# Patient Record
Sex: Female | Born: 1935 | Race: White | Hispanic: No | Marital: Married | Smoking: Former smoker
Health system: Southern US, Community
[De-identification: ages and names within clinical notes are randomized; demographics above are authoritative.]

## PROBLEM LIST (undated history)

## (undated) DIAGNOSIS — M810 Age-related osteoporosis without current pathological fracture: Secondary | ICD-10-CM

## (undated) DIAGNOSIS — H269 Unspecified cataract: Secondary | ICD-10-CM

## (undated) DIAGNOSIS — F329 Major depressive disorder, single episode, unspecified: Secondary | ICD-10-CM

## (undated) DIAGNOSIS — F32A Depression, unspecified: Secondary | ICD-10-CM

## (undated) DIAGNOSIS — M1990 Unspecified osteoarthritis, unspecified site: Secondary | ICD-10-CM

## (undated) DIAGNOSIS — T7840XA Allergy, unspecified, initial encounter: Secondary | ICD-10-CM

## (undated) HISTORY — DX: Allergy, unspecified, initial encounter: T78.40XA

## (undated) HISTORY — DX: Major depressive disorder, single episode, unspecified: F32.9

## (undated) HISTORY — DX: Unspecified osteoarthritis, unspecified site: M19.90

## (undated) HISTORY — DX: Unspecified cataract: H26.9

## (undated) HISTORY — PX: OTHER SURGICAL HISTORY: SHX169

## (undated) HISTORY — DX: Age-related osteoporosis without current pathological fracture: M81.0

## (undated) HISTORY — DX: Depression, unspecified: F32.A

---

## 1999-12-28 ENCOUNTER — Ambulatory Visit (HOSPITAL_COMMUNITY): Admission: RE | Admit: 1999-12-28 | Discharge: 1999-12-28 | Payer: Self-pay | Admitting: *Deleted

## 2000-05-04 ENCOUNTER — Encounter: Payer: Self-pay | Admitting: *Deleted

## 2000-05-04 ENCOUNTER — Encounter: Admission: RE | Admit: 2000-05-04 | Discharge: 2000-05-04 | Payer: Self-pay | Admitting: *Deleted

## 2000-06-28 ENCOUNTER — Other Ambulatory Visit: Admission: RE | Admit: 2000-06-28 | Discharge: 2000-06-28 | Payer: Self-pay | Admitting: *Deleted

## 2001-12-04 ENCOUNTER — Other Ambulatory Visit: Admission: RE | Admit: 2001-12-04 | Discharge: 2001-12-04 | Payer: Self-pay | Admitting: Internal Medicine

## 2001-12-12 ENCOUNTER — Encounter: Payer: Self-pay | Admitting: Internal Medicine

## 2001-12-12 ENCOUNTER — Encounter: Admission: RE | Admit: 2001-12-12 | Discharge: 2001-12-12 | Payer: Self-pay | Admitting: Internal Medicine

## 2002-04-29 ENCOUNTER — Encounter: Admission: RE | Admit: 2002-04-29 | Discharge: 2002-04-29 | Payer: Self-pay | Admitting: Internal Medicine

## 2002-04-29 ENCOUNTER — Encounter: Payer: Self-pay | Admitting: Internal Medicine

## 2002-05-08 ENCOUNTER — Encounter: Admission: RE | Admit: 2002-05-08 | Discharge: 2002-05-08 | Payer: Self-pay | Admitting: Internal Medicine

## 2002-05-08 ENCOUNTER — Encounter: Payer: Self-pay | Admitting: Internal Medicine

## 2002-05-17 ENCOUNTER — Ambulatory Visit (HOSPITAL_COMMUNITY): Admission: RE | Admit: 2002-05-17 | Discharge: 2002-05-17 | Payer: Self-pay | Admitting: Gastroenterology

## 2002-06-12 ENCOUNTER — Ambulatory Visit (HOSPITAL_COMMUNITY): Admission: RE | Admit: 2002-06-12 | Discharge: 2002-06-12 | Payer: Self-pay | Admitting: Gastroenterology

## 2004-05-05 ENCOUNTER — Encounter: Admission: RE | Admit: 2004-05-05 | Discharge: 2004-05-05 | Payer: Self-pay | Admitting: Internal Medicine

## 2004-07-27 ENCOUNTER — Encounter: Admission: RE | Admit: 2004-07-27 | Discharge: 2004-07-27 | Payer: Self-pay | Admitting: Internal Medicine

## 2004-11-08 ENCOUNTER — Encounter: Admission: RE | Admit: 2004-11-08 | Discharge: 2005-02-06 | Payer: Self-pay | Admitting: *Deleted

## 2005-05-31 ENCOUNTER — Encounter: Admission: RE | Admit: 2005-05-31 | Discharge: 2005-05-31 | Payer: Self-pay | Admitting: Internal Medicine

## 2005-09-09 ENCOUNTER — Encounter: Admission: RE | Admit: 2005-09-09 | Discharge: 2005-09-09 | Payer: Self-pay | Admitting: Internal Medicine

## 2006-01-19 ENCOUNTER — Other Ambulatory Visit: Admission: RE | Admit: 2006-01-19 | Discharge: 2006-01-19 | Payer: Self-pay | Admitting: Obstetrics and Gynecology

## 2007-08-01 ENCOUNTER — Encounter: Admission: RE | Admit: 2007-08-01 | Discharge: 2007-08-01 | Payer: Self-pay | Admitting: Internal Medicine

## 2008-09-02 ENCOUNTER — Encounter: Admission: RE | Admit: 2008-09-02 | Discharge: 2008-09-02 | Payer: Self-pay | Admitting: Internal Medicine

## 2008-09-05 ENCOUNTER — Encounter: Admission: RE | Admit: 2008-09-05 | Discharge: 2008-09-05 | Payer: Self-pay | Admitting: Internal Medicine

## 2009-09-03 ENCOUNTER — Encounter: Admission: RE | Admit: 2009-09-03 | Discharge: 2009-09-03 | Payer: Self-pay | Admitting: Internal Medicine

## 2010-05-10 ENCOUNTER — Encounter: Payer: Self-pay | Admitting: Internal Medicine

## 2010-09-27 ENCOUNTER — Other Ambulatory Visit: Payer: Self-pay | Admitting: Internal Medicine

## 2010-09-27 DIAGNOSIS — Z1231 Encounter for screening mammogram for malignant neoplasm of breast: Secondary | ICD-10-CM

## 2010-09-29 ENCOUNTER — Ambulatory Visit
Admission: RE | Admit: 2010-09-29 | Discharge: 2010-09-29 | Disposition: A | Discharge status: Home / Self Care | Payer: Medicare Other | Source: Ambulatory Visit | Attending: Internal Medicine | Admitting: Internal Medicine

## 2010-09-29 DIAGNOSIS — Z1231 Encounter for screening mammogram for malignant neoplasm of breast: Secondary | ICD-10-CM

## 2011-01-27 ENCOUNTER — Other Ambulatory Visit: Payer: Self-pay | Admitting: Neurology

## 2011-01-27 DIAGNOSIS — R430 Anosmia: Secondary | ICD-10-CM

## 2011-01-28 ENCOUNTER — Ambulatory Visit
Admission: RE | Admit: 2011-01-28 | Discharge: 2011-01-28 | Disposition: A | Discharge status: Home / Self Care | Payer: Medicare Other | Source: Ambulatory Visit | Attending: Neurology | Admitting: Neurology

## 2011-01-28 DIAGNOSIS — R430 Anosmia: Secondary | ICD-10-CM

## 2011-05-16 ENCOUNTER — Other Ambulatory Visit: Payer: Self-pay | Admitting: Surgery

## 2011-11-28 ENCOUNTER — Other Ambulatory Visit: Payer: Self-pay | Admitting: Internal Medicine

## 2011-11-28 DIAGNOSIS — R6881 Early satiety: Secondary | ICD-10-CM

## 2011-12-07 ENCOUNTER — Ambulatory Visit
Admission: RE | Admit: 2011-12-07 | Discharge: 2011-12-07 | Disposition: A | Discharge status: Home / Self Care | Payer: Medicare Other | Source: Ambulatory Visit | Attending: Internal Medicine | Admitting: Internal Medicine

## 2011-12-07 ENCOUNTER — Other Ambulatory Visit: Payer: Self-pay | Admitting: Internal Medicine

## 2011-12-07 DIAGNOSIS — R6881 Early satiety: Secondary | ICD-10-CM

## 2013-05-28 ENCOUNTER — Ambulatory Visit: Payer: Medicare Other

## 2013-05-28 ENCOUNTER — Ambulatory Visit (INDEPENDENT_AMBULATORY_CARE_PROVIDER_SITE_OTHER): Payer: Medicare Other | Admitting: Family Medicine

## 2013-05-28 VITALS — BP 120/62 | HR 61 | Temp 98.2°F | Resp 16 | Ht 63.0 in | Wt 117.0 lb

## 2013-05-28 DIAGNOSIS — R05 Cough: Secondary | ICD-10-CM

## 2013-05-28 DIAGNOSIS — R042 Hemoptysis: Secondary | ICD-10-CM

## 2013-05-28 DIAGNOSIS — R0781 Pleurodynia: Secondary | ICD-10-CM

## 2013-05-28 DIAGNOSIS — R059 Cough, unspecified: Secondary | ICD-10-CM

## 2013-05-28 DIAGNOSIS — S2001XA Contusion of right breast, initial encounter: Secondary | ICD-10-CM

## 2013-05-28 DIAGNOSIS — H11009 Unspecified pterygium of unspecified eye: Secondary | ICD-10-CM

## 2013-05-28 NOTE — Patient Instructions (Signed)
Take Tylenol not to exceed 2 pills 3 times daily  Practice good deep breathing to the best of your ability given the pain  Return if worse

## 2013-05-28 NOTE — Progress Notes (Signed)
Subjective: Patient tripped and fell on a heavy cardboard box 3 days ago. She is hurting on the right chest wall and has bruising of her right breast. It hurts to take a deep breath.  Objective: Lungs are clear. She has a large ecchymosis in the lower outer quadrant of the right breast. Hematoma is about 1 inch in diameter. Chest wall is tender to palpation underneath that.  [facility] reading (PRIMARY) by  Dr. Alwyn RenHopper No rib fracture noted  Assessment: Chest wall contusion Right breast contusion and small hematoma  Plan: Tylenol Give it time it should resolve Return if worse.

## 2014-02-21 ENCOUNTER — Other Ambulatory Visit: Payer: Self-pay | Admitting: Internal Medicine

## 2014-02-21 ENCOUNTER — Ambulatory Visit
Admission: RE | Admit: 2014-02-21 | Discharge: 2014-02-21 | Disposition: A | Discharge status: Home / Self Care | Payer: Medicare Other | Source: Ambulatory Visit | Attending: Internal Medicine | Admitting: Internal Medicine

## 2014-02-21 DIAGNOSIS — K5909 Other constipation: Secondary | ICD-10-CM

## 2015-08-13 ENCOUNTER — Emergency Department (HOSPITAL_COMMUNITY): Payer: Medicare Other

## 2015-08-13 ENCOUNTER — Other Ambulatory Visit: Payer: Self-pay

## 2015-08-13 ENCOUNTER — Emergency Department (HOSPITAL_COMMUNITY)
Admission: EM | Admit: 2015-08-13 | Discharge: 2015-08-13 | Disposition: A | Discharge status: Home / Self Care | Payer: Medicare Other | Attending: Emergency Medicine | Admitting: Emergency Medicine

## 2015-08-13 DIAGNOSIS — R002 Palpitations: Secondary | ICD-10-CM | POA: Insufficient documentation

## 2015-08-13 DIAGNOSIS — Z87891 Personal history of nicotine dependence: Secondary | ICD-10-CM | POA: Diagnosis not present

## 2015-08-13 DIAGNOSIS — M25511 Pain in right shoulder: Secondary | ICD-10-CM | POA: Insufficient documentation

## 2015-08-13 DIAGNOSIS — Z8659 Personal history of other mental and behavioral disorders: Secondary | ICD-10-CM | POA: Insufficient documentation

## 2015-08-13 DIAGNOSIS — Z8639 Personal history of other endocrine, nutritional and metabolic disease: Secondary | ICD-10-CM | POA: Diagnosis not present

## 2015-08-13 DIAGNOSIS — H269 Unspecified cataract: Secondary | ICD-10-CM | POA: Diagnosis not present

## 2015-08-13 DIAGNOSIS — M25512 Pain in left shoulder: Secondary | ICD-10-CM | POA: Diagnosis not present

## 2015-08-13 DIAGNOSIS — Z79899 Other long term (current) drug therapy: Secondary | ICD-10-CM | POA: Insufficient documentation

## 2015-08-13 LAB — CBC
HCT: 40.2 % (ref 36.0–46.0)
Hemoglobin: 13.6 g/dL (ref 12.0–15.0)
MCH: 28.1 pg (ref 26.0–34.0)
MCHC: 33.8 g/dL (ref 30.0–36.0)
MCV: 83.1 fL (ref 78.0–100.0)
Platelets: 226 10*3/uL (ref 150–400)
RBC: 4.84 MIL/uL (ref 3.87–5.11)
RDW: 13.7 % (ref 11.5–15.5)
WBC: 7.7 10*3/uL (ref 4.0–10.5)

## 2015-08-13 LAB — BASIC METABOLIC PANEL
Anion gap: 12 (ref 5–15)
BUN: 12 mg/dL (ref 6–20)
CO2: 22 mmol/L (ref 22–32)
Calcium: 9.5 mg/dL (ref 8.9–10.3)
Chloride: 109 mmol/L (ref 101–111)
Creatinine, Ser: 0.99 mg/dL (ref 0.44–1.00)
GFR calc Af Amer: 57 mL/min — ABNORMAL LOW (ref >60)
GFR calc non Af Amer: 49 mL/min — ABNORMAL LOW (ref >60)
Glucose, Bld: 79 mg/dL (ref 65–99)
Potassium: 3.7 mmol/L (ref 3.5–5.1)
Sodium: 143 mmol/L (ref 135–145)

## 2015-08-13 LAB — I-STAT TROPONIN, ED
Troponin i, poc: 0 ng/mL (ref 0.00–0.08)
Troponin i, poc: 0 ng/mL (ref 0.00–0.08)

## 2015-08-13 NOTE — ED Provider Notes (Signed)
CSN: [id number]     Arrival date & time 08/13/15  1102 History   First MD Initiated Contact with Patient 08/13/15 1229     Chief Complaint  Patient presents with  . Palpitations     (Consider location/radiation/quality/duration/timing/severity/associated sxs/prior Treatment) HPI Comments: 82 year old female with history of osteoporosis presents for palpitations. The patient and her daughter state that over the last 2 or so months she has had intermittent episodes of feeling like her heart is racing and pounding very hard. She says she also gets a pain across her shoulders when this occurs. These episodes resolve on their own with rest. She saw her primary care physician who suggested that she follow-up with a cardiologist outpatient but has yet to do so. She reports that she had another episode like this this morning and that she does typically feel short of breath with them and felt short of breath at that time. She reports that these symptoms have all resolved and she currently feels well. She has continued to be able to partake in her activities including dancing. Denies leg swelling. Denies headache. No arm pain or jaw pain. No focal weakness or sensory changes.   Past Medical History  Diagnosis Date  . Allergy   . Arthritis   . Cataract   . Depression   . Osteoporosis    Past Surgical History  Procedure Laterality Date  . Cataracts     Family History  Problem Relation Age of Onset  . Stroke Mother   . Stroke Father   . Heart disease Father    Social History  Substance Use Topics  . Smoking status: Former Games developer  . Smokeless tobacco: Not on file  . Alcohol Use: No   OB History    No data available     Review of Systems  Constitutional: Negative for fever, chills, appetite change and fatigue.  HENT: Negative for congestion, rhinorrhea, sinus pressure and sore throat.   Eyes: Negative for visual disturbance.  Respiratory: Positive for shortness of breath (resolved).  Negative for cough and chest tightness.   Cardiovascular: Positive for palpitations. Negative for chest pain.  Gastrointestinal: Negative for nausea, vomiting, abdominal pain and diarrhea.  Genitourinary: Negative for dysuria, urgency and hematuria.  Musculoskeletal: Positive for back pain (currently resolved but pain across the shoulders). Negative for myalgias and neck pain.  Skin: Negative for rash.  Neurological: Negative for dizziness, syncope, weakness and numbness.  Hematological: Does not bruise/bleed easily.      Allergies  Review of patient's allergies indicates no known allergies.  Home Medications   Prior to Admission medications   Medication Sig Start Date End Date Taking? Authorizing Provider  Ascorbic Acid (VITAMIN C) 100 MG tablet Take 100 mg by mouth daily.   Yes Historical Provider, MD  Multiple Vitamin (MULTIVITAMIN) tablet Take 1 tablet by mouth daily.   Yes Historical Provider, MD  omeprazole (PRILOSEC) 10 MG capsule Take 10 mg by mouth daily.   Yes Historical Provider, MD  vitamin B-12 (CYANOCOBALAMIN) 1000 MCG tablet Take 1,000 mcg by mouth daily.   Yes Historical Provider, MD  vitamin E 100 UNIT capsule Take 100 Units by mouth daily.   Yes Historical Provider, MD   BP 112/55 mmHg  Pulse 51  Temp(Src) 97.8 F (36.6 C) (Oral)  Resp 19  SpO2 97% Physical Exam  Constitutional: She is oriented to person, place, and time. She appears well-developed and well-nourished. No distress.  HENT:  Head: Normocephalic and atraumatic.  Right Ear: External  ear normal.  Left Ear: External ear normal.  Nose: Nose normal.  Mouth/Throat: Oropharynx is clear and moist. No oropharyngeal exudate.  Eyes: EOM are normal. Pupils are equal, round, and reactive to light.  Neck: Normal range of motion. Neck supple.  Cardiovascular: Normal rate, regular rhythm, normal heart sounds and intact distal pulses.   No murmur heard. Pulmonary/Chest: Effort normal. No respiratory distress.  She has no wheezes. She has no rales.  Abdominal: Soft. She exhibits no distension. There is no tenderness.  Musculoskeletal: Normal range of motion. She exhibits no edema or tenderness.  Neurological: She is alert and oriented to person, place, and time.  Skin: Skin is warm and dry. No rash noted. She is not diaphoretic.  Vitals reviewed.   ED Course  Procedures (including critical care time) Labs Review Labs Reviewed  BASIC METABOLIC PANEL - Abnormal; Notable for the following:    GFR calc non Af Amer 49 (*)    GFR calc Af Amer 57 (*)    All other components within normal limits  CBC  I-STAT TROPOININ, ED  Rosezena SensorI-STAT TROPOININ, ED    Imaging Review Dg Chest 2 View  08/13/2015  CLINICAL DATA:  Back pain and short of breath EXAM: CHEST  2 VIEW COMPARISON:  None. FINDINGS: COPD with hyperinflation of the lungs. Mild scarring. EKG leads over the lung apices. Negative for infiltrate or effusion. Negative for heart failure. No mass lesion. Hiatal hernia noted with air-fluid level. IMPRESSION: Advanced COPD.  No superimposed acute abnormality Hiatal hernia Electronically Signed   By: Marlan Palauharles  Clark M.D.   On: 08/13/2015 12:16   I have personally reviewed and evaluated these images and lab results as part of my medical decision-making.   EKG Interpretation   Date/Time:  Thursday August 13 2015 11:07:30 EDT Ventricular Rate:  80 PR Interval:  132 QRS Duration: 72 QT Interval:  362 QTC Calculation: 417 R Axis:   65 Text Interpretation:  Normal sinus rhythm Cannot rule out Anterior infarct  , age undetermined Abnormal ECG No previous ECGs available Confirmed by  Tyrone AppleNGUYEN, EMILY ([id number]) on 08/13/2015 12:20:07 PM      MDM  Patient was seen and evaluated in stable condition. Patient asymptomatic and in sinus rhythm. EKG with no acute findings. Patient felt well. She continues to be very active with her dancing outpatient. No significant comorbidities. Patient was observed and underwent a 2  troponin rule out. Both troponins were normal. Chest x-ray was unremarkable other than signs of COPD although no wheezing on examination and patient not short of breath. Cardiology arranged an outpatient follow-up in the office for continued evaluation of her palpitations. Patient and daughter were updated on results and plan of care they expressed understanding and agreement. Strict return precautions were given. They were told they could not make the arrange cardiology appointment that they needed to call as soon as possible to arrange for different appointment. Final diagnoses:  Palpitations    1. Palpitations    Leta BaptistEmily Roe Nguyen, MD 08/14/15 [phone]

## 2015-08-13 NOTE — Discharge Instructions (Signed)
You were seen and evaluated today for your palpitations. The cause is not clear. Cardiology has set up a follow-up appointment for you. If you cannot attend this appointment make sure you call to reschedule. Return with sudden worsening of symptoms or return of symptoms that are persistent.  Palpitations A palpitation is the feeling that your heartbeat is irregular or is faster than normal. It may feel like your heart is fluttering or skipping a beat. Palpitations are usually not a serious problem. However, in some cases, you may need further medical evaluation. CAUSES  Palpitations can be caused by:  Smoking.  Caffeine or other stimulants, such as diet pills or energy drinks.  Alcohol.  Stress and anxiety.  Strenuous physical activity.  Fatigue.  Certain medicines.  Heart disease, especially if you have a history of irregular heart rhythms (arrhythmias), such as atrial fibrillation, atrial flutter, or supraventricular tachycardia.  An improperly working pacemaker or defibrillator. DIAGNOSIS  To find the cause of your palpitations, your health care provider will take your medical history and perform a physical exam. Your health care provider may also have you take a test called an ambulatory electrocardiogram (ECG). An ECG records your heartbeat patterns over a 24-hour period. You may also have other tests, such as:  Transthoracic echocardiogram (TTE). During echocardiography, sound waves are used to evaluate how blood flows through your heart.  Transesophageal echocardiogram (TEE).  Cardiac monitoring. This allows your health care provider to monitor your heart rate and rhythm in real time.  Holter monitor. This is a portable device that records your heartbeat and can help diagnose heart arrhythmias. It allows your health care provider to track your heart activity for several days, if needed.  Stress tests by exercise or by giving medicine that makes the heart beat  faster. TREATMENT  Treatment of palpitations depends on the cause of your symptoms and can vary greatly. Most cases of palpitations do not require any treatment other than time, relaxation, and monitoring your symptoms. Other causes, such as atrial fibrillation, atrial flutter, or supraventricular tachycardia, usually require further treatment. HOME CARE INSTRUCTIONS   Avoid:  Caffeinated coffee, tea, soft drinks, diet pills, and energy drinks.  Chocolate.  Alcohol.  Stop smoking if you smoke.  Reduce your stress and anxiety. Things that can help you relax include:  A method of controlling things in your body, such as your heartbeats, with your mind (biofeedback).  Yoga.  Meditation.  Physical activity such as swimming, jogging, or walking.  Get plenty of rest and sleep. SEEK MEDICAL CARE IF:   You continue to have a fast or irregular heartbeat beyond 24 hours.  Your palpitations occur more often. SEEK IMMEDIATE MEDICAL CARE IF:  You have chest pain or shortness of breath.  You have a severe headache.  You feel dizzy or you faint. MAKE SURE YOU:  Understand these instructions.  Will watch your condition.  Will get help right away if you are not doing well or get worse.   This information is not intended to replace advice given to you by your health care provider. Make sure you discuss any questions you have with your health care provider.   Document Released: 04/01/2000 Document Revised: 04/09/2013 Document Reviewed: 06/03/2011 Elsevier Interactive Patient Education [employer]2016 Elsevier Inc.

## 2015-08-13 NOTE — ED Notes (Signed)
Pt reports feeling her heart racing this morning and having pain in her back. Pt reports sob with movement.

## 2015-08-13 NOTE — ED Notes (Signed)
MD Cyndie ChimeNguyen at bedside with patient and family.

## 2015-08-24 ENCOUNTER — Ambulatory Visit: Payer: Medicare Other | Admitting: Cardiology

## 2015-11-06 ENCOUNTER — Ambulatory Visit: Payer: Medicare Other | Admitting: Interventional Cardiology

## 2016-08-14 ENCOUNTER — Emergency Department (HOSPITAL_COMMUNITY): Payer: Medicare Other

## 2016-08-14 ENCOUNTER — Emergency Department (HOSPITAL_COMMUNITY)
Admission: EM | Admit: 2016-08-14 | Discharge: 2016-08-14 | Disposition: A | Discharge status: Home / Self Care | Payer: Medicare Other | Attending: Emergency Medicine | Admitting: Emergency Medicine

## 2016-08-14 ENCOUNTER — Encounter (HOSPITAL_COMMUNITY): Payer: Self-pay | Admitting: Emergency Medicine

## 2016-08-14 DIAGNOSIS — Z87891 Personal history of nicotine dependence: Secondary | ICD-10-CM | POA: Diagnosis not present

## 2016-08-14 DIAGNOSIS — J181 Lobar pneumonia, unspecified organism: Secondary | ICD-10-CM | POA: Diagnosis not present

## 2016-08-14 DIAGNOSIS — J189 Pneumonia, unspecified organism: Secondary | ICD-10-CM

## 2016-08-14 DIAGNOSIS — R0602 Shortness of breath: Secondary | ICD-10-CM | POA: Diagnosis present

## 2016-08-14 LAB — CBC
HCT: 41.2 % (ref 36.0–46.0)
Hemoglobin: 13.9 g/dL (ref 12.0–15.0)
MCH: 28.3 pg (ref 26.0–34.0)
MCHC: 33.7 g/dL (ref 30.0–36.0)
MCV: 83.7 fL (ref 78.0–100.0)
Platelets: 185 10*3/uL (ref 150–400)
RBC: 4.92 MIL/uL (ref 3.87–5.11)
RDW: 13.5 % (ref 11.5–15.5)
WBC: 6.9 10*3/uL (ref 4.0–10.5)

## 2016-08-14 LAB — BASIC METABOLIC PANEL
Anion gap: 11 (ref 5–15)
BUN: 16 mg/dL (ref 6–20)
CO2: 23 mmol/L (ref 22–32)
Calcium: 8.7 mg/dL — ABNORMAL LOW (ref 8.9–10.3)
Chloride: 102 mmol/L (ref 101–111)
Creatinine, Ser: 1.04 mg/dL — ABNORMAL HIGH (ref 0.44–1.00)
GFR calc Af Amer: 54 mL/min — ABNORMAL LOW (ref >60)
GFR calc non Af Amer: 46 mL/min — ABNORMAL LOW (ref >60)
Glucose, Bld: 103 mg/dL — ABNORMAL HIGH (ref 65–99)
Potassium: 3.9 mmol/L (ref 3.5–5.1)
Sodium: 136 mmol/L (ref 135–145)

## 2016-08-14 LAB — I-STAT TROPONIN, ED: Troponin i, poc: 0.05 ng/mL (ref 0.00–0.08)

## 2016-08-14 MED ORDER — BENZONATATE 100 MG PO CAPS
100.0000 mg | ORAL_CAPSULE | Freq: Once | ORAL | Status: AC
  Administered 2016-08-14: 100 mg via ORAL
  Filled 2016-08-14: qty 1

## 2016-08-14 MED ORDER — ACETAMINOPHEN 325 MG PO TABS
650.0000 mg | ORAL_TABLET | Freq: Once | ORAL | Status: AC
  Administered 2016-08-14: 650 mg via ORAL
  Filled 2016-08-14: qty 2

## 2016-08-14 MED ORDER — BENZONATATE 100 MG PO CAPS: 100.0000 mg | ORAL_CAPSULE | Freq: Three times a day (TID) | ORAL | 0 refills | Status: DC

## 2016-08-14 MED ORDER — LEVOFLOXACIN 250 MG PO TABS: 250.0000 mg | ORAL_TABLET | Freq: Every day | ORAL | 0 refills | Status: DC

## 2016-08-14 MED ORDER — LEVOFLOXACIN 750 MG PO TABS
750.0000 mg | ORAL_TABLET | Freq: Once | ORAL | Status: AC
Start: 2016-08-14 — End: 2016-08-14
  Administered 2016-08-14: 750 mg via ORAL
  Filled 2016-08-14: qty 1

## 2016-08-14 NOTE — Discharge Instructions (Signed)
Although your x-ray does not show a pneumonia, your exam, fever, and cough are highly suggestive of a left lower lobe pneumonia. You're being prescribed a cough suppressant and an antibiotic to take every day. Rarely, the antibiotic because joint pain, if you develop any significant arthritis pain or pain with movement of a joint stop the Levaquin and see your primary care doctor. Tylenol as needed for fever.

## 2016-08-14 NOTE — ED Provider Notes (Signed)
[facility]-EMERGENCY DEPT Provider Note   CSN: [id number] Arrival date & time: 08/14/16  1938     History   Chief Complaint Chief Complaint  Patient presents with  . Cough  . Shortness of Breath    HPI Kimberly Mcconnell is a 83 y.o. female. CC:  cough  HPI:  Chief complaint is cough.Kimberly Mcconnell Presents with her family. She's had 3 days of symptoms were on Thursday night she had some shoulder pain. This was posterior. Has not anterior chest. She's had no anterior chest pain. She's had a cough. She denies really feeling short of breath and states her cough is bothering her. She felt "chilled" tonight. No temperature triage. In the room her temperature is 11.5. She is awake alert Sprite conversant and states she feels "fine".  Past Medical History:  Diagnosis Date  . Allergy   . Arthritis   . Cataract   . Depression   . Osteoporosis     There are no active problems to display for this patient.   Past Surgical History:  Procedure Laterality Date  . cataracts      OB History    No data available       Home Medications    Prior to Admission medications   Medication Sig Start Date End Date Taking? Authorizing Provider  Ascorbic Acid (VITAMIN C) 100 MG tablet Take 100 mg by mouth daily.    Historical Provider, MD  benzonatate (TESSALON) 100 MG capsule Take 1 capsule (100 mg total) by mouth every 8 (eight) hours. 08/14/16   Rolland Porter, MD  levofloxacin (LEVAQUIN) 250 MG tablet Take 1 tablet (250 mg total) by mouth daily. 08/14/16   Rolland Porter, MD  Multiple Vitamin (MULTIVITAMIN) tablet Take 1 tablet by mouth daily.    Historical Provider, MD  omeprazole (PRILOSEC) 10 MG capsule Take 10 mg by mouth daily.    Historical Provider, MD  vitamin B-12 (CYANOCOBALAMIN) 1000 MCG tablet Take 1,000 mcg by mouth daily.    Historical Provider, MD  vitamin E 100 UNIT capsule Take 100 Units by mouth daily.    Historical Provider, MD    Family History Family History  Problem Relation Age of  Onset  . Stroke Mother   . Stroke Father   . Heart disease Father     Social History Social History  Substance Use Topics  . Smoking status: Former Games developer  . Smokeless tobacco: Not on file  . Alcohol use No     Allergies   Patient has no known allergies.   Review of Systems Review of Systems  Constitutional: Positive for chills. Negative for appetite change, diaphoresis, fatigue and fever.  HENT: Negative for mouth sores, sore throat and trouble swallowing.   Eyes: Negative for visual disturbance.  Respiratory: Positive for cough. Negative for chest tightness, shortness of breath and wheezing.   Cardiovascular: Negative for chest pain.  Gastrointestinal: Negative for abdominal distention, abdominal pain, diarrhea, nausea and vomiting.  Endocrine: Negative for polydipsia, polyphagia and polyuria.  Genitourinary: Negative for dysuria, frequency and hematuria.  Musculoskeletal: Negative for gait problem.  Skin: Negative for color change, pallor and rash.  Neurological: Negative for dizziness, syncope, light-headedness and headaches.  Hematological: Does not bruise/bleed easily.  Psychiatric/Behavioral: Negative for behavioral problems and confusion.     Physical Exam Updated Vital Signs BP (!) 182/66 (BP Location: Right Arm)   Pulse 75   Temp 98.6 F (37 C) (Oral)   Resp (!) 22   Ht  (  1.626 m)   Wt 106 lb (48.1 kg)   SpO2 100%   BMI 18.19 kg/m   Physical Exam  Constitutional: She is oriented to person, place, and time. She appears well-developed and well-nourished. No distress.  This is a thin elderly appearing but nontoxic appearing 83 year old female. She is communicative. She does not have dyspnea with conversation. She has no specific complaints. She states when she coughs it hurts under her ribs but has no chest pain currently  HENT:  Head: Normocephalic.  Eyes: Conjunctivae are normal. Pupils are equal, round, and reactive to light. No scleral icterus.    Neck: Normal range of motion. Neck supple. No thyromegaly present.  Cardiovascular: Normal rate and regular rhythm.  Exam reveals no gallop and no friction rub.   No murmur heard. Pulmonary/Chest: No respiratory distress. She has no wheezes. She has no rales.  Congested sounding cough. The stethoscope she has crackles at the left base that do not clear with coughing. She is at increased worker breathing. She is not tachypneic. Saturations 98% on room air. Recheck oral temperature myself 101.6.  Abdominal: Soft. Bowel sounds are normal. She exhibits no distension. There is no tenderness. There is no rebound.  Musculoskeletal: Normal range of motion.  Neurological: She is alert and oriented to person, place, and time.  Skin: Skin is warm and dry. No rash noted.  Psychiatric: She has a normal mood and affect. Her behavior is normal.     ED Treatments / Results  Labs (all labs ordered are listed, but only abnormal results are displayed) Labs Reviewed  BASIC METABOLIC PANEL - Abnormal; Notable for the following:       Result Value   Glucose, Bld 103 (*)    Creatinine, Ser 1.04 (*)    Calcium 8.7 (*)    GFR calc non Af Amer 46 (*)    GFR calc Af Amer 54 (*)    All other components within normal limits  CBC  I-STAT TROPOININ, ED    EKG  EKG Interpretation  Date/Time:  Sunday August 14 2016 19:44:59 EDT Ventricular Rate:  77 PR Interval:  142 QRS Duration: 72 QT Interval:  360 QTC Calculation: 407 R Axis:   2 Text Interpretation:  Normal sinus rhythm Low voltage QRS Nonspecific ST and T wave abnormality Abnormal ECG No significant change vs comparison Confirmed by Fayrene Fearing  MD, Champion Corales ([id number]) on 08/14/2016 8:14:16 PM       Radiology Dg Chest 2 View  Result Date: 08/14/2016 CLINICAL DATA:  Productive cough EXAM: CHEST  2 VIEW COMPARISON:  08/13/2015 FINDINGS: Bilateral hyperinflation with tiny pleural thickening or effusion. No focal consolidation. Stable cardiomediastinal silhouette  with atherosclerosis. No pneumothorax. Small to moderate hiatal hernia. IMPRESSION: Hyperinflation without acute infiltrate.  Hiatal hernia. Electronically Signed   By: Jasmine Pang M.D.   On: 08/14/2016 20:16    Procedures Procedures (including critical care time)  Medications Ordered in ED Medications  levofloxacin (LEVAQUIN) tablet 750 mg (750 mg Oral Given 08/14/16 2039)  acetaminophen (TYLENOL) tablet 650 mg (650 mg Oral Given 08/14/16 2040)  benzonatate (TESSALON) capsule 100 mg (100 mg Oral Given 08/14/16 2040)     Initial Impression / Assessment and Plan / ED Course  I have reviewed the triage vital signs and the nursing notes.  Pertinent labs & imaging results that were available during my care of the patient were reviewed by me and considered in my medical decision making (see chart for details).   I discussed the  testing with patient and family. Patient given Tylenol. She is ambulatory here does not have hypoxemia. She does not become tachycardic. Her blood pressures are normal. No leukocytosis or acidosis. Clinically with off, abnormal or exam and fever has probable radiographically occult pneumonia. I discussed this with him at length. I discussed treatment with Levaquin, Tylenol, and Tessalon. She has good support at home with family. They're all in favor of non-hospitalization as am I. I discussed than the potential for worsening which would require her returning. We discussed possibility of arthritis and tendinitis with Levaquin. We have 500 tonight and to 50 as she is small stature. Tessalon for cough which should be nonsedating for her. Have asked him to call her primary care range recheck within the next 2-3 days. ER with any worsening.  Final Clinical Impressions(s) / ED Diagnoses   Final diagnoses:  Pneumonia of left lower lobe due to infectious organism [facility])    New Prescriptions New Prescriptions   BENZONATATE (TESSALON) 100 MG CAPSULE    Take 1 capsule (100 mg  total) by mouth every 8 (eight) hours.   LEVOFLOXACIN (LEVAQUIN) 250 MG TABLET    Take 1 tablet (250 mg total) by mouth daily.     Rolland Porter, MD 08/14/16 2110

## 2016-08-14 NOTE — ED Triage Notes (Signed)
Patient arrives with complaint of shortness of breath, cough, chest pain, and fatigue. Onset about 1 week ago. Cough with moderate and clear production.

## 2017-10-17 ENCOUNTER — Emergency Department (HOSPITAL_COMMUNITY): Payer: Medicare Other

## 2017-10-17 ENCOUNTER — Emergency Department (HOSPITAL_COMMUNITY)
Admission: EM | Admit: 2017-10-17 | Discharge: 2017-10-17 | Disposition: A | Discharge status: Home / Self Care | Payer: Medicare Other | Attending: Emergency Medicine | Admitting: Emergency Medicine

## 2017-10-17 ENCOUNTER — Encounter (HOSPITAL_COMMUNITY): Payer: Self-pay

## 2017-10-17 DIAGNOSIS — R079 Chest pain, unspecified: Secondary | ICD-10-CM | POA: Diagnosis present

## 2017-10-17 DIAGNOSIS — Z79899 Other long term (current) drug therapy: Secondary | ICD-10-CM | POA: Insufficient documentation

## 2017-10-17 DIAGNOSIS — R002 Palpitations: Secondary | ICD-10-CM | POA: Diagnosis not present

## 2017-10-17 DIAGNOSIS — Z87891 Personal history of nicotine dependence: Secondary | ICD-10-CM | POA: Diagnosis not present

## 2017-10-17 DIAGNOSIS — I471 Supraventricular tachycardia: Secondary | ICD-10-CM | POA: Insufficient documentation

## 2017-10-17 LAB — BASIC METABOLIC PANEL
Anion gap: 11 (ref 5–15)
BUN: 16 mg/dL (ref 8–23)
CO2: 22 mmol/L (ref 22–32)
Calcium: 8.9 mg/dL (ref 8.9–10.3)
Chloride: 108 mmol/L (ref 98–111)
Creatinine, Ser: 1.02 mg/dL — ABNORMAL HIGH (ref 0.44–1.00)
GFR calc Af Amer: 54 mL/min — ABNORMAL LOW (ref >60)
GFR calc non Af Amer: 47 mL/min — ABNORMAL LOW (ref >60)
Glucose, Bld: 97 mg/dL (ref 70–99)
Potassium: 4.5 mmol/L (ref 3.5–5.1)
Sodium: 141 mmol/L (ref 135–145)

## 2017-10-17 LAB — CBC
HCT: 41.7 % (ref 36.0–46.0)
Hemoglobin: 13.5 g/dL (ref 12.0–15.0)
MCH: 28.4 pg (ref 26.0–34.0)
MCHC: 32.4 g/dL (ref 30.0–36.0)
MCV: 87.8 fL (ref 78.0–100.0)
Platelets: 186 10*3/uL (ref 150–400)
RBC: 4.75 MIL/uL (ref 3.87–5.11)
RDW: 13.6 % (ref 11.5–15.5)
WBC: 7.3 10*3/uL (ref 4.0–10.5)

## 2017-10-17 NOTE — ED Triage Notes (Signed)
Pt arrived via [facility]; pt from [facility] with c/o chest pain with radiation in jaw; stated that [place] wasn't feeling well; [place] upon [place] arrival, SVT at 140; vagal didn't work but self converted when IV was placed. 106/50; 60; 97% on RA; CBG 123

## 2017-10-17 NOTE — ED Notes (Signed)
Pt back from X-ray.  

## 2017-10-17 NOTE — Discharge Instructions (Addendum)
Return for any recurrent rapid heart rates.  Make an appointment to follow-up with cardiology for further evaluation.  Return for any additional new or worse symptoms.

## 2017-10-17 NOTE — ED Notes (Signed)
Updated pt on transportation back to [place].

## 2017-10-17 NOTE — ED Notes (Signed)
[facility] Pt wearing personal clothing. Pt alert. [facility] advised pt is next on their list to be picked up.

## 2017-10-17 NOTE — ED Provider Notes (Signed)
[facility] EMERGENCY DEPARTMENT Provider Note   CSN: [id number] Arrival date & time: 10/17/17  1207     History   Chief Complaint Chief Complaint  Patient presents with  . Chest Pain    SVT w/ conversion    HPI Kimberly Mcconnell is a 84 y.o. female.  Patient brought in by EMS.  Patient had supraventricular tachycardia.  Converted back to normal sinus rhythm spontaneously when they started the IV.  Patient remained in normal sinus rhythm.  Patient without any specific complaints.  Patient's had supraventricular tachycardia in the past.  Has been referred to cardiology but refers to go.  Patient could tell that her heart was going fast this morning that is why she called EMS.  No significant chest pain no significant shortness of breath.     Past Medical History:  Diagnosis Date  . Allergy   . Arthritis   . Cataract   . Depression   . Osteoporosis     There are no active problems to display for this patient.   Past Surgical History:  Procedure Laterality Date  . cataracts       OB History   None      Home Medications    Prior to Admission medications   Medication Sig Start Date End Date Taking? Authorizing Provider  Ascorbic Acid (VITAMIN C) 100 MG tablet Take 100 mg by mouth daily.   Yes [provider]  cholecalciferol (VITAMIN D) 400 units TABS tablet Take 400 Units by mouth daily.   Yes [provider]  Multiple Vitamin (MULTIVITAMIN) tablet Take 1 tablet by mouth daily.   Yes [provider]  vitamin B-12 (CYANOCOBALAMIN) 1000 MCG tablet Take 1,000 mcg by mouth daily.   Yes [provider]  vitamin E 100 UNIT capsule Take 100 Units by mouth daily.   Yes [provider]  benzonatate (TESSALON) 100 MG capsule Take 1 capsule (100 mg total) by mouth every 8 (eight) hours. Patient not taking: Reported on 10/17/2017 08/14/16   Rolland Porter, MD  levofloxacin (LEVAQUIN) 250 MG tablet Take 1 tablet (250 mg  total) by mouth daily. Patient not taking: Reported on 10/17/2017 08/14/16   Rolland Porter, MD    Family History Family History  Problem Relation Age of Onset  . Stroke Mother   . Stroke Father   . Heart disease Father     Social History Social History   Tobacco Use  . Smoking status: Former Smoker  Substance Use Topics  . Alcohol use: No  . Drug use: No     Allergies   Patient has no known allergies.   Review of Systems Review of Systems  Constitutional: Negative for fever.  HENT: Negative for congestion.   Eyes: Negative for visual disturbance.  Respiratory: Negative for shortness of breath.   Cardiovascular: Positive for palpitations. Negative for chest pain.  Gastrointestinal: Negative for abdominal pain.  Genitourinary: Negative for dysuria.  Musculoskeletal: Negative for back pain.  Skin: Negative for rash.  Neurological: Negative for syncope.  Hematological: Does not bruise/bleed easily.  Psychiatric/Behavioral: Negative for confusion.     Physical Exam Updated Vital Signs BP (!) 158/54   Pulse (!) 57   Temp 97.8 F (36.6 C) (Oral)   Resp 18   SpO2 99%   Physical Exam  Constitutional: She appears well-developed and well-nourished. No distress.  HENT:  Head: Normocephalic and atraumatic.  Mouth/Throat: Oropharynx is clear and moist.  Eyes: Pupils are equal, round,  and reactive to light. Conjunctivae and EOM are normal.  Neck: Neck supple.  Cardiovascular: Normal rate, regular rhythm and normal heart sounds.  Pulmonary/Chest: Effort normal and breath sounds normal. No respiratory distress.  Abdominal: Soft. Bowel sounds are normal. There is no tenderness.  Musculoskeletal: She exhibits no edema.  Neurological: She is alert. No cranial nerve deficit or sensory deficit. She exhibits normal muscle tone. Coordination normal.  Skin: Skin is warm.  Nursing note and vitals reviewed.    ED Treatments / Results  Labs (all labs ordered are listed, but  only abnormal results are displayed) Labs Reviewed  BASIC METABOLIC PANEL - Abnormal; Notable for the following components:      Result Value   Creatinine, Ser 1.02 (*)    GFR calc non Af Amer 47 (*)    GFR calc Af Amer 54 (*)    All other components within normal limits  CBC    EKG EKG Interpretation  Date/Time:  Tuesday October 17 2017 12:20:38 EDT Ventricular Rate:  60 PR Interval:    QRS Duration: 90 QT Interval:  406 QTC Calculation: 406 R Axis:   4 Text Interpretation:  Sinus rhythm Borderline low voltage, extremity leads Confirmed by Vanetta MuldersZackowski, Mervin Ramires [phone]([id number]) on 10/17/2017 12:50:25 PM   Radiology Dg Chest 2 View  Result Date: 10/17/2017 CLINICAL DATA:  Chest pain and cardiac palpitations EXAM: CHEST - 2 VIEW COMPARISON:  August 14, 2016 FINDINGS: Lungs are somewhat hyperexpanded. There is no edema or consolidation. Heart is mildly enlarged with pulmonary vascularity normal. No adenopathy. There is aortic atherosclerosis. No evident bone lesions. IMPRESSION: Lungs somewhat hyperexpanded without edema or consolidation. Heart mildly enlarged. There is aortic atherosclerosis. Aortic Atherosclerosis (ICD10-I70.0). Electronically Signed   By: Bretta BangWilliam  Woodruff III M.D.   On: 10/17/2017 14:13    Procedures Procedures (including critical care time)  Medications Ordered in ED Medications - No data to display   Initial Impression / Assessment and Plan / ED Course  I have reviewed the triage vital signs and the nursing notes.  Pertinent labs & imaging results that were available during my care of the patient were reviewed by me and considered in my medical decision making (see chart for details).      Patient remained in sinus rhythm here.  Work-up without any acute findings.  Chest x-ray negative basic labs without significant abnormalities other than some renal insufficiency.  Patient with recommendation to follow-up with cardiology for further evaluation.  Patient given referral  to cardiology.  Final Clinical Impressions(s) / ED Diagnoses   Final diagnoses:  SVT (supraventricular tachycardia) [facility](HCC)    ED Discharge Orders    None       Vanetta MuldersZackowski, Mila Pair, MD 10/17/17 2325

## 2019-11-21 ENCOUNTER — Telehealth: Payer: Self-pay | Admitting: Internal Medicine

## 2019-11-21 NOTE — Telephone Encounter (Signed)
Spoke with patient's daughter, Marigene Ehlers, regarding Palliative services and she was in agreement with beginning services.  I have scheduled an In-person Consult for 12/11/19 @ 1 PM.

## 2019-12-11 ENCOUNTER — Other Ambulatory Visit: Payer: Self-pay

## 2019-12-11 ENCOUNTER — Other Ambulatory Visit: Payer: Medicare Other | Admitting: Internal Medicine

## 2019-12-11 DIAGNOSIS — Z515 Encounter for palliative care: Secondary | ICD-10-CM

## 2019-12-11 DIAGNOSIS — F0390 Unspecified dementia without behavioral disturbance: Secondary | ICD-10-CM | POA: Insufficient documentation

## 2019-12-11 DIAGNOSIS — F0281 Dementia in other diseases classified elsewhere with behavioral disturbance: Secondary | ICD-10-CM

## 2019-12-11 DIAGNOSIS — R413 Other amnesia: Secondary | ICD-10-CM

## 2019-12-11 NOTE — Progress Notes (Signed)
    Therapist, nutritional Palliative Care Consult Note Telephone: [phone]  Fax: [phone]  PATIENT NAME: Kimberly Mcconnell DOB: 1925/03/05 MRN: [id number]  PRIMARY CARE PROVIDER:   Lewis Moccasin, MD  REFERRING PROVIDER:  Lewis Moccasin, MD [address] [place],  [place] [address]  RESPONSIBLE PARTYMarigene Mcconnell Daughter [place] [phone]       RECOMMENDATIONS and PLAN:   Palliative care encounter:  Z51.5  1.  Advance care planning:  Discussed palliative care to patient and her daughter Kimberly Mcconnell.  Reviewed goals of care which include focus on quality of life within her independent living environment with the assistance of hired caregivers. Daughter states that she would like to have simple health issues treated without extensive advanced therapies. MOST form was reviewed and delegations made.  DNAR, limited additional interventions,  IV fluids and antibiotics if indicated and no tube feedings. Documents were uploaded to Epic.   Palliative care will follow-up with patient in aprox 6 weeks.     2. Dementia with behaviors:  Daughter prefers no treatment other than current vitamins and supplements.  Newly hired caregivers will provide supportive assistance.  Promote safe and healthy and relaxing environment.  Cueing as needed. Encouraged daughter to avoid challenging of patient's incorrect responses and information provided.   I spent 60 minutes providing this consultation,  from 1330 to 1430. More than 50% of the time in this consultation was spent coordinating communication with patient and her daughter.[name]   HISTORY OF PRESENT ILLNESS:  Kimberly Mcconnell is a 86 y.o. year old female with multiple medical problems including arthritis, depression, dementia as related to "cerebral atrophy" per head CT scan in 2019. Palliative Care was asked to help address goals of care.   CODE STATUS: DNR/DNI  PPS: 50% HOSPICE ELIGIBILITY/DIAGNOSIS: TBD  PAST MEDICAL  HISTORY:  Past Medical History:  Diagnosis Date  . Allergy   . Arthritis   . Cataract   . Depression   . Osteoporosis     SOCIAL HX: Widowed.  Lives alone  ALLERGIES: No Known Allergies   PERTINENT MEDICATIONS:  Outpatient Encounter Medications as of 12/11/2019  Medication Sig  . Ascorbic Acid (VITAMIN C) 100 MG tablet Take 100 mg by mouth daily.  . benzonatate (TESSALON) 100 MG capsule Take 1 capsule (100 mg total) by mouth every 8 (eight) hours. (Patient not taking: Reported on 10/17/2017)  . cholecalciferol (VITAMIN D) 400 units TABS tablet Take 400 Units by mouth daily.  [name] levofloxacin (LEVAQUIN) 250 MG tablet Take 1 tablet (250 mg total) by mouth daily. (Patient not taking: Reported on 10/17/2017)  . Multiple Vitamin (MULTIVITAMIN) tablet Take 1 tablet by mouth daily.  . vitamin B-12 (CYANOCOBALAMIN) 1000 MCG tablet Take 1,000 mcg by mouth daily.  . vitamin E 100 UNIT capsule Take 100 Units by mouth daily.   No facility-administered encounter medications on file as of 12/11/2019.    PHYSICAL EXAM:   General: NAD, elderly, well nourished female Cardiovascular: regular rate and rhythm Pulmonary: clear ant fields, no increased respiratory effort Abdomen: soft, nontender, + bowel sounds Extremities: no edema, no joint deformities Skin: exposed skin is intact Neurological: A&O to person and place. Disoriented to time.  Frequently repetitive conversation and delivery of incorrent data Psych:  Anxious mood along with slight agitation during discussion of personal hygiene  Margaretha Sheffield, NP-C

## 2020-02-19 ENCOUNTER — Other Ambulatory Visit: Payer: Medicare Other | Admitting: Internal Medicine

## 2020-02-20 ENCOUNTER — Other Ambulatory Visit: Payer: Self-pay

## 2020-02-20 NOTE — Progress Notes (Signed)
    Therapist, nutritional Palliative Care Consult Note Telephone: [phone]  Fax: [phone]  PATIENT NAME: Kimberly Mcconnell DOB: 08/24/1924 MRN: [id number]  PRIMARY CARE PROVIDER:   Lewis Moccasin, MD  REFERRING PROVIDER:  Lewis Moccasin, MD [address] [place],  [place] [address]  RESPONSIBLE PARTYMarigene Mcconnell Daughter [place] [phone]       RECOMMENDATIONS and PLAN:   Palliative care encounter:  Z51.5  1.  Advance care planning:  Discussed palliative care to patient and her daughter Kimberly Mcconnell.  Reviewed goals of care which include focus on quality of life within her independent living environment with the assistance of hired caregivers. Daughter states that she would like to have simple health issues treated without extensive advanced therapies. MOST form was reviewed and delegations made.  DNAR, limited additional interventions,  IV fluids and antibiotics if indicated and no tube feedings. Documents were uploaded to Epic.   Palliative care will follow-up with patient in aprox 6 weeks.     2. Dementia with behaviors:  Daughter prefers no treatment other than current vitamins and supplements.  Newly hired caregivers will provide supportive assistance.  Promote safe and healthy and relaxing environment.  Cueing as needed. Encouraged daughter to avoid challenging of patient's incorrect responses and information provided.    I spent 60 minutes providing this consultation,  from 1330 to 1430. More than 50% of the time in this consultation was spent coordinating communication with patient and her daughter.Kimberly Mcconnell   HISTORY OF PRESENT ILLNESS: Follow-up with Kimberly Mcconnell related to symptom management and readdressing goals of care. Palliative Care was asked to help address goals of care.   CODE STATUS: DNR/DNI  PPS: 50% HOSPICE ELIGIBILITY/DIAGNOSIS: TBD  PAST MEDICAL HISTORY:  Past Medical History:  Diagnosis Date  . Allergy   . Arthritis   .  Cataract   . Depression   . Osteoporosis     SOCIAL HX: Widowed.  [place]  ALLERGIES: No Known Allergies   PERTINENT MEDICATIONS:  Outpatient Encounter Medications as of 12/11/2019  Medication Sig  . Ascorbic Acid (VITAMIN C) 100 MG tablet Take 100 mg by mouth daily.  . benzonatate (TESSALON) 100 MG capsule Take 1 capsule (100 mg total) by mouth every 8 (eight) hours. (Patient not taking: Reported on 10/17/2017)  . cholecalciferol (VITAMIN D) 400 units TABS tablet Take 400 Units by mouth daily.  Kimberly Mcconnell levofloxacin (LEVAQUIN) 250 MG tablet Take 1 tablet (250 mg total) by mouth daily. (Patient not taking: Reported on 10/17/2017)  . Multiple Vitamin (MULTIVITAMIN) tablet Take 1 tablet by mouth daily.  . vitamin B-12 (CYANOCOBALAMIN) 1000 MCG tablet Take 1,000 mcg by mouth daily.  . vitamin E 100 UNIT capsule Take 100 Units by mouth daily.   No facility-administered encounter medications on file as of 12/11/2019.    PHYSICAL EXAM:   General: NAD, elderly, well nourished female sitting on sofa Cardiovascular: irregular rhythm Pulmonary: clear ant fields, no increased respiratory effort Abdomen: soft, nontender, + bowel sounds Extremities: Skin: 2x0.5cm wound R anterior distal lower extremity wit dried eschar.  Surrounding skin is erythematous and tender to touch Neurological: A&O to person and place. Very immediately forgetful  Frequently repetitive conversation and delivery of incorrent data Psych:  Anxious and defensive mood   Kimberly Sheffield, NP-C

## 2020-04-01 ENCOUNTER — Other Ambulatory Visit: Payer: Medicare Other | Admitting: Internal Medicine

## 2020-04-01 ENCOUNTER — Other Ambulatory Visit: Payer: Self-pay

## 2020-04-01 NOTE — Progress Notes (Unsigned)
    Therapist, nutritional Palliative Care Consult Note Telephone: [phone]  Fax: [phone]  PATIENT NAME: Kimberly Mcconnell DOB: 1924/06/01 MRN: [id number]  PRIMARY CARE PROVIDER:   Lewis Moccasin, MD  REFERRING PROVIDER:  Lewis Moccasin, MD [address] [place],  [place] [address]  RESPONSIBLE PARTYMarigene Ehlers Daughter [place] [phone]       RECOMMENDATIONS and PLAN:   Palliative care encounter:  Z51.5  1.  Advance care planning:  Discussed palliative care to patient and her daughter [name].  Reviewed goals of care which include focus on quality of life within her independent living environment with the assistance of hired caregivers. Daughter states that she would like to have simple health issues treated without extensive advanced therapies. MOST form was reviewed and delegations made.  DNAR, limited additional interventions,  IV fluids and antibiotics if indicated and no tube feedings. Documents were uploaded to Epic.   Palliative care will follow-up with patient in aprox 6 weeks.     2. Dementia with behaviors:  Begin Fluoxetine 10mg  daily for depression. Rx will be sent to [facility] on [facility]. Daughter and caregivers will continue. Newly hired caregivers will provide supportive assistance.  Promote safe and healthy and relaxing environment.  Cueing as needed. Encouraged daughter to avoid challenging of patient's incorrect responses and information provided.     I spent 60 minutes providing this consultation,  from 1000 to 1045. More than 50% of the time in this consultation was spent coordinating communication with patient and her daughter.  HISTORY OF PRESENT ILLNESS: Follow-up with [facility] related to symptom management and readdressing goals of care. Palliative Care was asked to help address goals of care.   CODE STATUS: DNR/DNI  PPS: 50% HOSPICE ELIGIBILITY/DIAGNOSIS: TBD  PAST MEDICAL HISTORY:  Past Medical  History:  Diagnosis Date  . Allergy   . Arthritis   . Cataract   . Depression   . Osteoporosis     SOCIAL HX: Widowed.  [place]  ALLERGIES: No Known Allergies   PERTINENT MEDICATIONS:  Outpatient Encounter Medications as of 12/11/2019  Medication Sig  . Ascorbic Acid (VITAMIN C) 100 MG tablet Take 100 mg by mouth daily.  . benzonatate (TESSALON) 100 MG capsule Take 1 capsule (100 mg total) by mouth every 8 (eight) hours. (Patient not taking: Reported on 10/17/2017)  . cholecalciferol (VITAMIN D) 400 units TABS tablet Take 400 Units by mouth daily.  12/18/2017 levofloxacin (LEVAQUIN) 250 MG tablet Take 1 tablet (250 mg total) by mouth daily. (Patient not taking: Reported on 10/17/2017)  . Multiple Vitamin (MULTIVITAMIN) tablet Take 1 tablet by mouth daily.  . vitamin B-12 (CYANOCOBALAMIN) 1000 MCG tablet Take 1,000 mcg by mouth daily.  . vitamin E 100 UNIT capsule Take 100 Units by mouth daily.   No facility-administered encounter medications on file as of 12/11/2019.    PHYSICAL EXAM:   General: NAD, elderly, well nourished female sitting on sofa Cardiovascular: irregular rhythm Pulmonary: clear ant fields, no increased respiratory effort Abdomen: soft, nontender, + bowel sounds Extremities: Skin: 2x0.5cm wound R anterior distal lower extremity wit dried eschar.  Surrounding skin is erythematous and tender to touch Neurological: A&O to person and place. Very immediately forgetful  Frequently repetitive conversation and delivery of incorrent data Psych:  Anxious and defensive mood   12/13/2019, NP-C

## 2020-05-12 ENCOUNTER — Other Ambulatory Visit: Payer: Self-pay

## 2020-05-12 ENCOUNTER — Other Ambulatory Visit: Payer: Medicare Other | Admitting: Hospice

## 2020-05-13 ENCOUNTER — Emergency Department (HOSPITAL_COMMUNITY): Payer: Medicare Other

## 2020-05-13 ENCOUNTER — Inpatient Hospital Stay (HOSPITAL_COMMUNITY)
Admission: EM | Admit: 2020-05-13 | Discharge: 2020-05-19 | DRG: 535 | Disposition: E | Payer: Medicare Other | Attending: Internal Medicine | Admitting: Internal Medicine

## 2020-05-13 ENCOUNTER — Encounter (HOSPITAL_COMMUNITY): Payer: Self-pay

## 2020-05-13 ENCOUNTER — Other Ambulatory Visit: Payer: Self-pay

## 2020-05-13 ENCOUNTER — Inpatient Hospital Stay (HOSPITAL_COMMUNITY): Payer: Medicare Other

## 2020-05-13 DIAGNOSIS — E860 Dehydration: Secondary | ICD-10-CM | POA: Diagnosis present

## 2020-05-13 DIAGNOSIS — R0902 Hypoxemia: Secondary | ICD-10-CM

## 2020-05-13 DIAGNOSIS — I724 Aneurysm of artery of lower extremity: Secondary | ICD-10-CM | POA: Diagnosis not present

## 2020-05-13 DIAGNOSIS — M810 Age-related osteoporosis without current pathological fracture: Secondary | ICD-10-CM | POA: Diagnosis present

## 2020-05-13 DIAGNOSIS — R531 Weakness: Secondary | ICD-10-CM

## 2020-05-13 DIAGNOSIS — I959 Hypotension, unspecified: Secondary | ICD-10-CM | POA: Diagnosis present

## 2020-05-13 DIAGNOSIS — I471 Supraventricular tachycardia: Secondary | ICD-10-CM | POA: Diagnosis present

## 2020-05-13 DIAGNOSIS — F0391 Unspecified dementia with behavioral disturbance: Secondary | ICD-10-CM | POA: Diagnosis present

## 2020-05-13 DIAGNOSIS — Z20822 Contact with and (suspected) exposure to covid-19: Secondary | ICD-10-CM | POA: Diagnosis present

## 2020-05-13 DIAGNOSIS — A419 Sepsis, unspecified organism: Secondary | ICD-10-CM | POA: Diagnosis present

## 2020-05-13 DIAGNOSIS — Z515 Encounter for palliative care: Secondary | ICD-10-CM

## 2020-05-13 DIAGNOSIS — E872 Acidosis: Secondary | ICD-10-CM | POA: Diagnosis present

## 2020-05-13 DIAGNOSIS — R52 Pain, unspecified: Secondary | ICD-10-CM | POA: Diagnosis not present

## 2020-05-13 DIAGNOSIS — M6282 Rhabdomyolysis: Secondary | ICD-10-CM | POA: Diagnosis present

## 2020-05-13 DIAGNOSIS — Z87891 Personal history of nicotine dependence: Secondary | ICD-10-CM | POA: Diagnosis not present

## 2020-05-13 DIAGNOSIS — S301XXA Contusion of abdominal wall, initial encounter: Secondary | ICD-10-CM | POA: Diagnosis present

## 2020-05-13 DIAGNOSIS — G9341 Metabolic encephalopathy: Secondary | ICD-10-CM | POA: Diagnosis present

## 2020-05-13 DIAGNOSIS — Y92099 Unspecified place in other non-institutional residence as the place of occurrence of the external cause: Secondary | ICD-10-CM | POA: Diagnosis not present

## 2020-05-13 DIAGNOSIS — Z66 Do not resuscitate: Secondary | ICD-10-CM | POA: Diagnosis present

## 2020-05-13 DIAGNOSIS — S72009A Fracture of unspecified part of neck of unspecified femur, initial encounter for closed fracture: Secondary | ICD-10-CM | POA: Diagnosis present

## 2020-05-13 DIAGNOSIS — W1830XA Fall on same level, unspecified, initial encounter: Secondary | ICD-10-CM | POA: Diagnosis present

## 2020-05-13 DIAGNOSIS — D631 Anemia in chronic kidney disease: Secondary | ICD-10-CM | POA: Diagnosis present

## 2020-05-13 DIAGNOSIS — Z823 Family history of stroke: Secondary | ICD-10-CM | POA: Diagnosis not present

## 2020-05-13 DIAGNOSIS — S7290XA Unspecified fracture of unspecified femur, initial encounter for closed fracture: Secondary | ICD-10-CM

## 2020-05-13 DIAGNOSIS — Z8249 Family history of ischemic heart disease and other diseases of the circulatory system: Secondary | ICD-10-CM | POA: Diagnosis not present

## 2020-05-13 DIAGNOSIS — Z681 Body mass index (BMI) 19 or less, adult: Secondary | ICD-10-CM | POA: Diagnosis not present

## 2020-05-13 DIAGNOSIS — S72002A Fracture of unspecified part of neck of left femur, initial encounter for closed fracture: Principal | ICD-10-CM | POA: Diagnosis present

## 2020-05-13 DIAGNOSIS — R7401 Elevation of levels of liver transaminase levels: Secondary | ICD-10-CM | POA: Diagnosis present

## 2020-05-13 DIAGNOSIS — N1831 Chronic kidney disease, stage 3a: Secondary | ICD-10-CM | POA: Diagnosis present

## 2020-05-13 DIAGNOSIS — Z789 Other specified health status: Secondary | ICD-10-CM | POA: Diagnosis not present

## 2020-05-13 DIAGNOSIS — D62 Acute posthemorrhagic anemia: Secondary | ICD-10-CM | POA: Diagnosis present

## 2020-05-13 DIAGNOSIS — R64 Cachexia: Secondary | ICD-10-CM | POA: Diagnosis present

## 2020-05-13 DIAGNOSIS — J9601 Acute respiratory failure with hypoxia: Secondary | ICD-10-CM | POA: Diagnosis not present

## 2020-05-13 DIAGNOSIS — N179 Acute kidney failure, unspecified: Secondary | ICD-10-CM | POA: Diagnosis present

## 2020-05-13 DIAGNOSIS — Z79899 Other long term (current) drug therapy: Secondary | ICD-10-CM

## 2020-05-13 DIAGNOSIS — R0600 Dyspnea, unspecified: Secondary | ICD-10-CM | POA: Diagnosis not present

## 2020-05-13 DIAGNOSIS — Z7189 Other specified counseling: Secondary | ICD-10-CM | POA: Diagnosis not present

## 2020-05-13 DIAGNOSIS — W19XXXA Unspecified fall, initial encounter: Secondary | ICD-10-CM | POA: Diagnosis not present

## 2020-05-13 DIAGNOSIS — I739 Peripheral vascular disease, unspecified: Secondary | ICD-10-CM | POA: Diagnosis not present

## 2020-05-13 DIAGNOSIS — I7777 Dissection of artery of lower extremity: Secondary | ICD-10-CM | POA: Diagnosis present

## 2020-05-13 LAB — CBC WITH DIFFERENTIAL/PLATELET
Abs Immature Granulocytes: 0.19 10*3/uL — ABNORMAL HIGH (ref 0.00–0.07)
Basophils Absolute: 0 10*3/uL (ref 0.0–0.1)
Basophils Relative: 0 %
Eosinophils Absolute: 0 10*3/uL (ref 0.0–0.5)
Eosinophils Relative: 0 %
HCT: 26.7 % — ABNORMAL LOW (ref 36.0–46.0)
Hemoglobin: 9.3 g/dL — ABNORMAL LOW (ref 12.0–15.0)
Immature Granulocytes: 1 %
Lymphocytes Relative: 7 %
Lymphs Abs: 1.5 10*3/uL (ref 0.7–4.0)
MCH: 30.1 pg (ref 26.0–34.0)
MCHC: 34.8 g/dL (ref 30.0–36.0)
MCV: 86.4 fL (ref 80.0–100.0)
Monocytes Absolute: 1.6 10*3/uL — ABNORMAL HIGH (ref 0.1–1.0)
Monocytes Relative: 8 %
Neutro Abs: 16.5 10*3/uL — ABNORMAL HIGH (ref 1.7–7.7)
Neutrophils Relative %: 84 %
Platelets: 177 10*3/uL (ref 150–400)
RBC: 3.09 MIL/uL — ABNORMAL LOW (ref 3.87–5.11)
RDW: 13.8 % (ref 11.5–15.5)
WBC: 20.4 10*3/uL — ABNORMAL HIGH (ref 4.0–10.5)
nRBC: 0 % (ref 0.0–0.2)

## 2020-05-13 LAB — COMPREHENSIVE METABOLIC PANEL
ALT: 20 U/L (ref 0–44)
AST: 49 U/L — ABNORMAL HIGH (ref 15–41)
Albumin: 3.2 g/dL — ABNORMAL LOW (ref 3.5–5.0)
Alkaline Phosphatase: 41 U/L (ref 38–126)
Anion gap: 15 (ref 5–15)
BUN: 20 mg/dL (ref 8–23)
CO2: 17 mmol/L — ABNORMAL LOW (ref 22–32)
Calcium: 8.4 mg/dL — ABNORMAL LOW (ref 8.9–10.3)
Chloride: 105 mmol/L (ref 98–111)
Creatinine, Ser: 1.49 mg/dL — ABNORMAL HIGH (ref 0.44–1.00)
GFR, Estimated: 33 mL/min — ABNORMAL LOW (ref >60)
Glucose, Bld: 182 mg/dL — ABNORMAL HIGH (ref 70–99)
Potassium: 4.7 mmol/L (ref 3.5–5.1)
Sodium: 137 mmol/L (ref 135–145)
Total Bilirubin: 1.2 mg/dL (ref 0.3–1.2)
Total Protein: 4.9 g/dL — ABNORMAL LOW (ref 6.5–8.1)

## 2020-05-13 LAB — URINALYSIS, ROUTINE W REFLEX MICROSCOPIC
Bilirubin Urine: NEGATIVE
Glucose, UA: NEGATIVE mg/dL
Hgb urine dipstick: NEGATIVE
Ketones, ur: 5 mg/dL — AB
Nitrite: NEGATIVE
Protein, ur: 30 mg/dL — AB
Specific Gravity, Urine: >1.046 — ABNORMAL HIGH (ref 1.005–1.030)
pH: 5 (ref 5.0–8.0)

## 2020-05-13 LAB — LACTIC ACID, PLASMA
Lactic Acid, Venous: 7.6 mmol/L (ref 0.5–1.9)
Lactic Acid, Venous: 4.6 mmol/L (ref 0.5–1.9)

## 2020-05-13 LAB — SARS CORONAVIRUS 2 BY RT PCR (HOSPITAL ORDER, PERFORMED IN ~~LOC~~ HOSPITAL LAB): SARS Coronavirus 2: NEGATIVE

## 2020-05-13 LAB — LIPASE, BLOOD: Lipase: 24 U/L (ref 11–51)

## 2020-05-13 LAB — CK: Total CK: 664 U/L — ABNORMAL HIGH (ref 38–234)

## 2020-05-13 MED ORDER — CHLORHEXIDINE GLUCONATE 4 % EX LIQD: 60.0000 mL | Freq: Once | CUTANEOUS | Status: DC

## 2020-05-13 MED ORDER — HYDROMORPHONE HCL 1 MG/ML IJ SOLN
0.5000 mg | INTRAMUSCULAR | Status: DC | PRN
  Administered 2020-05-14 (×3): 0.5 mg via INTRAVENOUS
  Filled 2020-05-13 (×4): qty 1

## 2020-05-13 MED ORDER — SODIUM CHLORIDE 0.9 % IV BOLUS: 1000.0000 mL | Freq: Once | INTRAVENOUS | Status: DC

## 2020-05-13 MED ORDER — SODIUM CHLORIDE 0.9 % IV SOLN: INTRAVENOUS | Status: DC

## 2020-05-13 MED ORDER — CHOLECALCIFEROL 10 MCG (400 UNIT) PO TABS
400.0000 [IU] | ORAL_TABLET | Freq: Every day | ORAL | Status: DC
  Filled 2020-05-13: qty 1

## 2020-05-13 MED ORDER — VANCOMYCIN HCL IN DEXTROSE 1-5 GM/200ML-% IV SOLN
1000.0000 mg | Freq: Once | INTRAVENOUS | Status: AC
  Administered 2020-05-13: 1000 mg via INTRAVENOUS
  Filled 2020-05-13: qty 200

## 2020-05-13 MED ORDER — METOPROLOL TARTRATE 25 MG PO TABS: 12.5000 mg | ORAL_TABLET | Freq: Four times a day (QID) | ORAL | Status: DC | PRN

## 2020-05-13 MED ORDER — METRONIDAZOLE IN NACL 5-0.79 MG/ML-% IV SOLN
500.0000 mg | Freq: Once | INTRAVENOUS | Status: AC
  Administered 2020-05-13: 500 mg via INTRAVENOUS
  Filled 2020-05-13: qty 100

## 2020-05-13 MED ORDER — LACTATED RINGERS IV BOLUS (SEPSIS)
500.0000 mL | Freq: Once | INTRAVENOUS | Status: AC
  Administered 2020-05-13: 500 mL via INTRAVENOUS

## 2020-05-13 MED ORDER — LACTATED RINGERS IV SOLN: INTRAVENOUS | Status: DC

## 2020-05-13 MED ORDER — VITAMIN B-12 1000 MCG PO TABS: 1000.0000 ug | ORAL_TABLET | Freq: Every day | ORAL | Status: DC

## 2020-05-13 MED ORDER — SODIUM CHLORIDE 0.9 % IV SOLN: 2.0000 g | INTRAVENOUS | Status: DC

## 2020-05-13 MED ORDER — POVIDONE-IODINE 10 % EX SWAB: 2.0000 "application " | Freq: Once | CUTANEOUS | Status: DC

## 2020-05-13 MED ORDER — IOHEXOL 300 MG/ML  SOLN
75.0000 mL | Freq: Once | INTRAMUSCULAR | Status: AC | PRN
  Administered 2020-05-13: 75 mL via INTRAVENOUS

## 2020-05-13 MED ORDER — HYDROCODONE-ACETAMINOPHEN 5-325 MG PO TABS: 1.0000 | ORAL_TABLET | Freq: Four times a day (QID) | ORAL | Status: DC | PRN

## 2020-05-13 MED ORDER — SODIUM CHLORIDE 0.9 % IV SOLN
2.0000 g | Freq: Once | INTRAVENOUS | Status: AC
  Administered 2020-05-13: 2 g via INTRAVENOUS
  Filled 2020-05-13: qty 2

## 2020-05-13 MED ORDER — VANCOMYCIN HCL 750 MG/150ML IV SOLN: 750.0000 mg | INTRAVENOUS | Status: DC

## 2020-05-13 MED ORDER — FENTANYL CITRATE (PF) 100 MCG/2ML IJ SOLN
25.0000 ug | Freq: Once | INTRAMUSCULAR | Status: AC
  Administered 2020-05-13: 25 ug via INTRAVENOUS
  Filled 2020-05-13: qty 2

## 2020-05-13 MED ORDER — ADULT MULTIVITAMIN W/MINERALS CH
1.0000 | ORAL_TABLET | Freq: Every day | ORAL | Status: DC
  Filled 2020-05-13: qty 1

## 2020-05-13 MED ORDER — LACTATED RINGERS IV BOLUS (SEPSIS)
1000.0000 mL | Freq: Once | INTRAVENOUS | Status: AC
  Administered 2020-05-13: 1000 mL via INTRAVENOUS

## 2020-05-13 MED ORDER — CEFAZOLIN SODIUM-DEXTROSE 2-4 GM/100ML-% IV SOLN: 2.0000 g | INTRAVENOUS | Status: DC

## 2020-05-13 MED ORDER — VITAMIN E 45 MG (100 UNIT) PO CAPS
100.0000 [IU] | ORAL_CAPSULE | Freq: Every day | ORAL | Status: DC
  Filled 2020-05-13: qty 1

## 2020-05-13 MED ORDER — VITAMIN C 100 MG PO TABS: 100.0000 mg | ORAL_TABLET | Freq: Every day | ORAL | Status: DC

## 2020-05-13 NOTE — ED Provider Notes (Signed)
[facility] EMERGENCY DEPARTMENT Provider Note   CSN: [id number] Arrival date & time: 04/30/2020  1054     History Chief Complaint  Patient presents with  . Fall  . Weakness    Kimberly Mcconnell is a 87 y.o. female is in for evaluation of weakness and a fall.  Level 5 caveat due to dementia.  History obtained from EMS and patient's daughter, [name].  Patient was last seen normal at 130 yesterday afternoon when a caregiver left.  Between that time and this morning, patient was by herself.  This morning patient was found laying on the ground naked, covered in stool.  There was stool in the toilet and on the floor as well.  Patient was weaker than normal, she normally ambulates without assistance, was unable to ambulate with 2 person assist with EMS.  Patient is at baseline mental status, which is alert only to herself.  Daughter states patient lives in an independent living situation, has care for 4 hours every day.  She is on fluoxetine for behavior issues due to dementia, takes no other medications daily.  Is not on blood thinners.  HPI     Past Medical History:  Diagnosis Date  . Allergy   . Arthritis   . Cataract   . Depression   . Osteoporosis     Patient Active Problem List   Diagnosis Date Noted  . Dementia (HCC) 12/11/2019    Past Surgical History:  Procedure Laterality Date  . cataracts       OB History   No obstetric history on file.     Family History  Problem Relation Age of Onset  . Stroke Mother   . Stroke Father   . Heart disease Father     Social History   Tobacco Use  . Smoking status: Former Smoker  Substance Use Topics  . Alcohol use: No  . Drug use: No    Home Medications Prior to Admission medications   Medication Sig Start Date End Date Taking? Authorizing Provider  Ascorbic Acid (VITAMIN C) 100 MG tablet Take 100 mg by mouth daily.    [provider]  benzonatate (TESSALON) 100 MG capsule Take 1 capsule  (100 mg total) by mouth every 8 (eight) hours. Patient not taking: Reported on 10/17/2017 08/14/16   Rolland Porter, MD  cholecalciferol (VITAMIN D) 400 units TABS tablet Take 400 Units by mouth daily.    [provider]  levofloxacin (LEVAQUIN) 250 MG tablet Take 1 tablet (250 mg total) by mouth daily. Patient not taking: Reported on 10/17/2017 08/14/16   Rolland Porter, MD  Multiple Vitamin (MULTIVITAMIN) tablet Take 1 tablet by mouth daily.    [provider]  vitamin B-12 (CYANOCOBALAMIN) 1000 MCG tablet Take 1,000 mcg by mouth daily.    [provider]  vitamin E 100 UNIT capsule Take 100 Units by mouth daily.    [provider]    Allergies    Patient has no known allergies.  Review of Systems   Review of Systems  Unable to perform ROS: Dementia    Physical Exam Updated Vital Signs BP (!) 142/122   Pulse 73   Resp 15   SpO2 95%   Physical Exam Vitals and nursing note reviewed.  Constitutional:      General: She is not in acute distress.    Appearance: She is well-developed and well-nourished.     Comments: Confused, baseline per daughter  HENT:  Head: Normocephalic and atraumatic.  Eyes:     Extraocular Movements: Extraocular movements intact and EOM normal.     Conjunctiva/sclera: Conjunctivae normal.     Pupils: Pupils are equal, round, and reactive to light.  Neck:     Comments: No ttp of midline c-spine. Cardiovascular:     Rate and Rhythm: Normal rate and regular rhythm.     Pulses: Normal pulses and intact distal pulses.  Pulmonary:     Effort: Pulmonary effort is normal. Tachypnea present. No respiratory distress.     Breath sounds: Normal breath sounds. No wheezing.     Comments: Mildly tachypneic on my exam.  Clear lung sounds. Abdominal:     General: There is no distension.     Palpations: Abdomen is soft. There is no mass.     Tenderness: There is no abdominal tenderness. There is no guarding or rebound.     Comments: No  TTP  Musculoskeletal:        General: Normal range of motion.     Cervical back: Normal range of motion and neck supple.     Comments: Left leg shortened and externally rotated.  Pedal pulse 2+ bilaterally. No TTP of back or midline spine.  No obvious deformity or injury noted to the back.  Skin:    General: Skin is warm and dry.     Capillary Refill: Capillary refill takes less than 2 seconds.  Neurological:     Mental Status: Mental status is at baseline.     GCS: GCS eye subscore is 4. GCS verbal subscore is 5. GCS motor subscore is 6.     Cranial Nerves: Cranial nerves are intact.     Sensory: Sensation is intact.     Motor: Weakness present.     Comments: ANO x1, baseline.  GCS of 15.  Sensation intact x4.  Decreased lower extremity strength, patient unable to lift legs off bed, abnormal per daughter.  Able to follow simple commands.  Psychiatric:        Mood and Affect: Mood and affect normal.     ED Results / Procedures / Treatments   Labs (all labs ordered are listed, but only abnormal results are displayed) Labs Reviewed  URINE CULTURE  CULTURE, BLOOD (ROUTINE X 2)  CULTURE, BLOOD (ROUTINE X 2)  LACTIC ACID, PLASMA  LACTIC ACID, PLASMA  COMPREHENSIVE METABOLIC PANEL  CBC WITH DIFFERENTIAL/PLATELET  PROTIME-INR  APTT  URINALYSIS, ROUTINE W REFLEX MICROSCOPIC  LIPASE, BLOOD  CK    EKG None  Radiology No results found.  Procedures .Critical Care Performed by: Alveria Apley, PA-C Authorized by: Alveria Apley, PA-C   Critical care provider statement:    Critical care time (minutes):  50   Critical care time was exclusive of:  Separately billable procedures and treating other patients and teaching time   Critical care was necessary to treat or prevent imminent or life-threatening deterioration of the following conditions:  Sepsis   Critical care was time spent personally by me on the following activities:  Blood draw for specimens, development of  treatment plan with patient or surrogate, discussions with consultants, evaluation of patient's response to treatment, examination of patient, obtaining history from patient or surrogate, ordering and performing treatments and interventions, ordering and review of laboratory studies, ordering and review of radiographic studies, pulse oximetry, re-evaluation of patient's condition and review of old charts   I assumed direction of critical care for this patient from another provider in my specialty: no  Care discussed with: admitting provider   Comments:     Concern for sepsis. IV abx started and pt admitted.      Medications Ordered in ED Medications - No data to display  ED Course  I have reviewed the triage vital signs and the nursing notes.  Pertinent labs & imaging results that were available during my care of the patient were reviewed by me and considered in my medical decision making (see chart for details). MDM Rules/Calculators/A&P                          Patient presenting due to weakness and a presumed fall.  On exam, patient is alert to baseline, however due to her history of dementia, she is alert only to herself.  On my exam, left leg is shortened and externally rotated, concern for hip fracture.  However also consider other injury from the fall.  Consider cause of fall/weakness including infection, stroke, ACS.  As she had reported stool on her and in the house, will check CT abdomen pelvis.  Labs, urine, chest x-ray ordered.  Pelvis x-ray viewed interpreted by me, consistent with left hip fracture.  Chest x-ray without acute injury or obvious infection.  Discussed results with patient and daughter.  Consulted with Ortho.  Delay in labs due to patient being in the hallway.  Patient is also not on cardiac monitoring as she is in the hallway.  I have requested for patient to be in the bed multiple times.  Labs show elevated lactic at 7.6.  White count of 20.  Concern for  sepsis, code sepsis called, fluid resuscitation started, and antibiotics started.   On repeat vitals, patient is not tachycardic and hypertensive.  Concerning in the setting of possible sepsis versus arterial injury.  Additionally, patient's pain is likely contributing, pain medication given.  Discussed with Dr. Chipper Herb from triad hospitalist service, patient to be admitted.  Received call from radiology.  States on the CT abdomen, there is concern for a superficial femoral artery which is irregular and narrowed with wall thickening and no surrounding hematoma.  Concern for traumatic injury.  Recommends left lower extremity CTA to rule out vascular injury.  Discussed with patient and daughter, who are agreeable.   On repeat evaluation, heart rate and blood pressure are improving with fluids and pain control, will continue to monitor.   Final Clinical Impression(s) / ED Diagnoses Final diagnoses:  Sepsis, due to unspecified organism, unspecified whether acute organ dysfunction present [facility])  Closed fracture of left hip, initial encounter [facility])  Weakness    Rx / DC Orders ED Discharge Orders    None       Alveria Apley, PA-C 04/30/2020 2694    Rozelle Logan, DO 04/21/2020 [phone]

## 2020-05-13 NOTE — Progress Notes (Signed)
Pharmacy Antibiotic Note  Kimberly Mcconnell is a 87 y.o. female admitted on May 14, 2020 with sepsis from unknown source.  Pharmacy has been consulted for vancomycin and cefepime dosing.  Estimated weight ~52 kg per chart review and discussion with family member.  Scr 1.49, estimated CrCl ~20 ml/min. WBC 20.4, lactic acid 7.6,   Plan: Vancomycin 1000 mg x1 then 750 mg q48h - Predicted AUC 489 (goal 400-550), Scr 1.49 used Cefepime 2g q24h  Monitor renal function, cultures, labs, clinical progression     No data recorded.  Recent Labs  Lab 04/28/2020 1253 05/03/2020 1301  WBC  --  20.4*  CREATININE  --  1.49*  LATICACIDVEN 7.6*  --     CrCl cannot be calculated (Unknown ideal weight.).    No Known Allergies  Antimicrobials this admission: Cefepime 1/26 >>  Vancomcyin 1/26 >>  Flagyl 1/26 >>  Dose adjustments this admission:  Microbiology results: 1/26 BCx: sent 1/26 UCx: sent  1/26 UA: pending  Thank you for allowing pharmacy to be a part of this patient's care.  Laverna Peace, PharmD PGY-1 Pharmacy Resident May 14, 2020 2:11 PM Please see AMION for all pharmacy numbers

## 2020-05-13 NOTE — ED Triage Notes (Signed)
Pt BIB [facility] from [facility] independent living. Pt has hx of dementia and at baseline is only alert to her self and daughter. Pt has a caregiver that comes by for a couple hours during the day. Pt normally able to ambulate and take care of herself for the most part. Pt was last seen yesterday at 130 pm. When caregiver arrived this AM, pt was found down on the bathroom floor covered in feces. Pt stated she just got down in the floor and laid but unsure since pt has dementia. EMS stated no obvious injuries noted but pt was extremely weak and unable to stand. PT denies any pain but states she is cold.

## 2020-05-13 NOTE — Sepsis Progress Note (Signed)
Notified bedside nurse of need to draw blood cultures. Also requested the staff at the bedside to get an accurate weight as the one listed is over 39 days old.

## 2020-05-13 NOTE — Consult Note (Signed)
Reason for Consult:Hip fx Referring Physician: K Horton Time called: 1245 Time at bedside: 1257   Kimberly Mcconnell is an 87 y.o. female.  HPI: Yeily fell at home sometime last night. As near as they can determine she had an accident in the bathroom and was trying to change clothes when she fell and could not get up. HH aides found her this morning. She was brought to the ED where x-rays showed a left hip fx and orthopedic surgery was consulted. She lives in dependent care at [facility] and does not use any assistive devices to ambulate. She is also demented.  Past Medical History:  Diagnosis Date  . Allergy   . Arthritis   . Cataract   . Depression   . Osteoporosis     Past Surgical History:  Procedure Laterality Date  . cataracts      Family History  Problem Relation Age of Onset  . Stroke Mother   . Stroke Father   . Heart disease Father     Social History:  reports that she has quit smoking. She does not have any smokeless tobacco history on file. She reports that she does not drink alcohol and does not use drugs.  Allergies: No Known Allergies  Medications: I have reviewed the patient's current medications.  No results found for this or any previous visit (from the past 48 hour(s)).  DG Chest 2 View  Result Date: 04/30/2020 CLINICAL DATA:  87 year old female status post fall.  Left hip pain. EXAM: CHEST - 2 VIEW COMPARISON:  Chest radiographs 10/17/2017 and earlier. FINDINGS: Semi upright AP and lateral views of the chest. Moderate to large chronic hiatal hernia with an air-fluid level today. Borderline cardiomegaly. Other mediastinal contours are within normal limits. Calcified aortic atherosclerosis. Large lung volumes. No pneumothorax, pulmonary edema, pleural effusion or acute pulmonary opacity. Osteopenia.  No acute osseous abnormality identified. IMPRESSION: 1. No acute cardiopulmonary abnormality. 2. Chronic pulmonary hyperinflation. Moderate to large chronic  hiatal hernia. Aortic Atherosclerosis (ICD10-I70.0). Electronically Signed   By: Odessa Fleming M.D.   On: 05/09/2020 11:54   DG HIP UNILAT WITH PELVIS 2-3 VIEWS LEFT  Result Date: 05/05/2020 CLINICAL DATA:  LEFT hip externally rotated unable to internally rotate. Status post fall in this 87 year old female EXAM: DG HIP (WITH OR WITHOUT PELVIS) 2-3V RIGHT; DG HIP (WITH OR WITHOUT PELVIS) 2-3V LEFT COMPARISON:  No recent comparisons. Prior CT of the abdomen and pelvis from 2005. FINDINGS: AP view of the pelvis without signs of fracture of the bony pelvis. Assessment mildly limited by stool and gas overlying the sacrum and iliac wings bilaterally. RIGHT hip: Osteopenia. No fracture about the RIGHT hip. Soft tissues are unremarkable. LEFT hip: Comminuted intratrochanteric fracture of the LEFT proximal femur with varus angulation. Standard lateral view not obtainable due to severity of the fracture. Oblique view provided. No cross-table lateral. IMPRESSION: 1. Comminuted intratrochanteric fracture of the LEFT hip with moderate to marked varus angulation. 2. No fracture about the RIGHT hip or bony pelvis. 3. Osteopenia. Electronically Signed   By: Donzetta Kohut M.D.   On: 05/06/2020 12:03   DG HIP UNILAT WITH PELVIS 2-3 VIEWS RIGHT  Result Date: 05/02/2020 CLINICAL DATA:  LEFT hip externally rotated unable to internally rotate. Status post fall in this 87 year old female EXAM: DG HIP (WITH OR WITHOUT PELVIS) 2-3V RIGHT; DG HIP (WITH OR WITHOUT PELVIS) 2-3V LEFT COMPARISON:  No recent comparisons. Prior CT of the abdomen and pelvis from 2005. FINDINGS: AP view  of the pelvis without signs of fracture of the bony pelvis. Assessment mildly limited by stool and gas overlying the sacrum and iliac wings bilaterally. RIGHT hip: Osteopenia. No fracture about the RIGHT hip. Soft tissues are unremarkable. LEFT hip: Comminuted intratrochanteric fracture of the LEFT proximal femur with varus angulation. Standard lateral view not  obtainable due to severity of the fracture. Oblique view provided. No cross-table lateral. IMPRESSION: 1. Comminuted intratrochanteric fracture of the LEFT hip with moderate to marked varus angulation. 2. No fracture about the RIGHT hip or bony pelvis. 3. Osteopenia. Electronically Signed   By: Donzetta Kohut M.D.   On: 04/20/2020 12:03    Review of Systems  Unable to perform ROS: Dementia   Blood pressure (!) 142/122, pulse 73, resp. rate 15, SpO2 95 %. Physical Exam Constitutional:      General: She is not in acute distress.    Appearance: She is well-developed and well-nourished. She is not diaphoretic.  HENT:     Head: Normocephalic and atraumatic.  Eyes:     General: No scleral icterus.       Right eye: No discharge.        Left eye: No discharge.     Conjunctiva/sclera: Conjunctivae normal.  Cardiovascular:     Rate and Rhythm: Normal rate and regular rhythm.  Pulmonary:     Effort: Pulmonary effort is normal. No respiratory distress.  Musculoskeletal:     Cervical back: Normal range of motion.     Comments: LLE No traumatic wounds, ecchymosis, or rash  Nontender surprisingly  No knee or ankle effusion  Knee stable to varus/ valgus and anterior/posterior stress  Sens DPN, SPN, TN grossly intact  Motor EHL, ext, flex, evers grossly intact  DP 1+, PT 0, No significant edema  Skin:    General: Skin is warm and dry.  Neurological:     Mental Status: She is alert.  Psychiatric:        Mood and Affect: Mood and affect normal.        Behavior: Behavior normal.     Assessment/Plan: Left hip fx -- Recommend IMN tomorrow with Dr. August Saucer. Family on fence regarding surgery. Detailed pros and cons of surgery vs non-operative management. Will tentatively schedule for tomorrow morning to hold the operative slot. Multiple medical problems including dementia, osteoporosis, and possibly heart arrhythmia -- IM to see and admit, possibly clear. Appreciate their help.    Freeman Caldron, PA-C Orthopedic Surgery [phone] 05/04/2020, 1:18 PM

## 2020-05-13 NOTE — ED Notes (Signed)
Was not able to get a oral or axillary temp. Family member @ bedside did not want rectal to be done in the hallway

## 2020-05-13 NOTE — Consult Note (Signed)
[facility] [place]  Patient name: Kimberly Mcconnell MRN: [id number] DOB: 31-May-1924 Sex: female   REQUESTING PROVIDER:   ER   REASON FOR CONSULT:    Possible left femoral dissection  HISTORY OF PRESENT ILLNESS:   Kimberly Mcconnell is a 87 y.o. female, who is status post a unwitnessed fall and found unclothed on the bathroom floor with stool present and confusion.  She was found to have a left hip fracture.  She underwent a CT scan that showed a left groin hematoma and possible left SFA dissection/injury.  There was a mild hematoma.  The patient suffers from paroxysmal SVT, advanced dementia and osteoporosis.  She lives at an assisted living facility and ambulates without assistance.  PAST MEDICAL HISTORY    Past Medical History:  Diagnosis Date  . Allergy   . Arthritis   . Cataract   . Depression   . Osteoporosis      FAMILY HISTORY   Family History  Problem Relation Age of Onset  . Stroke Mother   . Stroke Father   . Heart disease Father     SOCIAL HISTORY:   Social History   Socioeconomic History  . Marital status: Married    Spouse name: Not on file  . Number of children: Not on file  . Years of education: Not on file  . Highest education level: Not on file  Occupational History  . Not on file  Tobacco Use  . Smoking status: Former Games developer  . Smokeless tobacco: Not on file  Substance and Sexual Activity  . Alcohol use: No  . Drug use: No  . Sexual activity: Not on file  Other Topics Concern  . Not on file  Social History Narrative  . Not on file   Social Determinants of Health   Financial Resource Strain: Not on file  Food Insecurity: Not on file  Transportation Needs: Not on file  Physical Activity: Not on file  Stress: Not on file  Social Connections: Not on file  Intimate Partner Violence: Not on file    ALLERGIES:    No Known Allergies  CURRENT MEDICATIONS:    Current  Facility-Administered Medications  Medication Dose Route Frequency Provider Last Rate Last Admin  . 0.9 %  sodium chloride infusion   Intravenous Continuous Emeline General, MD      . Melene Muller ON 06/03/20] ceFEPIme (MAXIPIME) 2 g in sodium chloride 0.9 % 100 mL IVPB  2 g Intravenous Q24H Horton, Kristie M, DO      . [START ON June 03, 2020] cholecalciferol (VITAMIN D3) tablet 400 Units  400 Units Oral Daily Mikey College T, MD      . HYDROcodone-acetaminophen (NORCO/VICODIN) 5-325 MG per tablet 1-2 tablet  1-2 tablet Oral Q6H PRN Mikey College T, MD      . HYDROmorphone (DILAUDID) injection 0.5 mg  0.5 mg Intravenous Q2H PRN Mikey College T, MD      . lactated ringers infusion   Intravenous Continuous Caccavale, Sophia, PA-C 150 mL/hr at 04/21/2020 1923 New Bag at 04/19/2020 1923  . metoprolol tartrate (LOPRESSOR) tablet 12.5 mg  12.5 mg Oral Q6H PRN Mikey College T, MD      . multivitamin with minerals tablet 1 tablet  1 tablet Oral Daily Mikey College T, MD      . Melene Muller ON 05/15/2020] vancomycin (VANCOREADY) IVPB 750 mg/150 mL  750 mg Intravenous Q48H Horton, Kristie M, DO      . [START ON 06/03/20]  vitamin B-12 (CYANOCOBALAMIN) tablet 1,000 mcg  1,000 mcg Oral Daily Emeline General, MD      . Melene Muller ON 04/23/2020] vitamin E capsule 100 Units  100 Units Oral Daily Emeline General, MD       Current Outpatient Medications  Medication Sig Dispense Refill  . cholecalciferol (VITAMIN D) 400 units TABS tablet Take 400 Units by mouth daily.    . famotidine (PEPCID) 20 MG tablet Take 20 mg by mouth daily as needed for heartburn or indigestion.    [name] FLUoxetine (PROZAC) 10 MG tablet Take 10 mg by mouth every morning.    . Multiple Vitamin (MULTIVITAMIN) tablet Take 1 tablet by mouth daily.      REVIEW OF SYSTEMS:   [X]  denotes positive finding, [ ]  denotes negative finding Cardiac  Comments:  Chest pain or chest pressure:    Shortness of breath upon exertion:    Short of breath when lying flat:    Irregular heart  rhythm:        Vascular    Pain in calf, thigh, or hip brought on by ambulation:    Pain in feet at night that wakes you up from your sleep:     Blood clot in your veins:    Leg swelling:         Pulmonary    Oxygen at home:    Productive cough:     Wheezing:         Neurologic    Sudden weakness in arms or legs:     Sudden numbness in arms or legs:     Sudden onset of difficulty speaking or slurred speech:    Temporary loss of vision in one eye:     Problems with dizziness:         Gastrointestinal    Blood in stool:      Vomited blood:         Genitourinary    Burning when urinating:     Blood in urine:        Psychiatric    Major depression:         Hematologic    Bleeding problems:    Problems with blood clotting too easily:        Skin    Rashes or ulcers:        Constitutional    Fever or chills:     PHYSICAL EXAM:   Vitals:   2020-06-04 1600 06-04-2020 1615 06-04-20 1707 06-04-20 1930  BP: 109/68 134/90 (!) 113/53 (!) 92/44  Pulse: 80 (!) 142 (!) 137 (!) 130  Resp: (!) 32 20 (!) 33 (!) 28  Temp:      TempSrc:      SpO2: 98% 98% 100% 100%    GENERAL: The patient is a well-nourished female, in no acute distress. The vital signs are documented above. CARDIAC: There is a regular rate and rhythm.  VASCULAR: Palpable left femoral pulse without hematoma or ecchymosis.  I could not palpate pedal pulses however both extremities were warm and well perfused PULMONARY: Nonlabored respirations ABDOMEN: Soft and non-tender with normal pitched bowel sounds.  MUSCULOSKELETAL: There are no major deformities or cyanosis. NEUROLOGIC: Normal sensation and motor function to both legs SKIN: There are no ulcers or rashes noted. PSYCHIATRIC: The patient has a normal affect.  STUDIES:   I have reviewed her CT scan with the following findings: 1. A 1.0 x 0.6 x 0.9 cm complex hypoechoic area noted in the left  upper thigh in the area of clinical concern. This could  represent a small hematoma or abscess.  2. Also noted is a 4.1 x 2.0 x 2.3 cm complex hypoechoic area noted around the left lower extremity graft. This could represent a process such as abscess, hematoma, or thrombosed pseudoaneurysm. The left lower extremity graft does not appear to be patent. ASSESSMENT and PLAN   Possible left superficial femoral artery dissection: I have reviewed her CT scan.  It is unclear whether or not this is truly a radiographic finding.  CT angiogram has been recommended however because of the patient's acute kidney insufficiency, additional contrast is not recommended.  Therefore, I have ordered a ultrasound to further evaluate this.  Regardless, because the patient does not have signs of vascular insufficiency in her leg, I doubt that any intervention would be warranted given her current clinical condition.  I do not think this should delay orthopedic repair of her hip.  Her hip surgery is currently on hold because of a sepsis work-up.  I will get the ultrasound in the morning and provide further recommendations.   Charlena Cross, MD, FACS [facility] [phone] Pager [phone]

## 2020-05-13 NOTE — H&P (Addendum)
History and Physical    Kimberly Mcconnell:[id number] DOB: [birth year] DOA: June 08, 2020  PCP: Lewis Moccasin, MD (Confirm with patient/family/NH records and if not entered, this has to be entered at [facility] point of entry) Patient coming from: Assist living  I have personally briefly reviewed patient's old medical records in [facility] Link  Chief Complaint: Pain  HPI: Kimberly Mcconnell is a 87 y.o. female with medical history significant of paroxysmal SVT, advanced dementia, osteoporosis, presented with mechanical fall and hip fracture.  Patient lives by herself in assisted living.  At baseline, patient was able to ambulate herself without help.  This time, patient was last seen normal yesterday afternoon by home care service.  Patient then was found naked on the bathroom floor, confused.  Likely patient had a unwitnessed fall sometime last night.  But due to dementia and confusion patient cannot provide further information. ED Course: X-ray showed left hip fracture.  Patient was found to be very tachycardia, lactic acid 7.6, creatinine 1.49 compared to baseline around 1.0.  WBC 20.  No clear source of infection was identified, CT abdomen pelvis with contrast showed large left groin hematoma and questionable SFA dissection/injury.  Review of Systems: Unable to perform patient demented and confused.    Past Medical History:  Diagnosis Date  . Allergy   . Arthritis   . Cataract   . Depression   . Osteoporosis     Past Surgical History:  Procedure Laterality Date  . cataracts       reports that she has quit smoking. She does not have any smokeless tobacco history on file. She reports that she does not drink alcohol and does not use drugs.  No Known Allergies  Family History  Problem Relation Age of Onset  . Stroke Mother   . Stroke Father   . Heart disease Father      Prior to Admission medications   Medication Sig Start Date End Date Taking? Authorizing Provider  Ascorbic Acid  (VITAMIN C) 100 MG tablet Take 100 mg by mouth daily.    [provider]  benzonatate (TESSALON) 100 MG capsule Take 1 capsule (100 mg total) by mouth every 8 (eight) hours. Patient not taking: Reported on 10/17/2017 08/14/16   Rolland Porter, MD  cholecalciferol (VITAMIN D) 400 units TABS tablet Take 400 Units by mouth daily.    [provider]  levofloxacin (LEVAQUIN) 250 MG tablet Take 1 tablet (250 mg total) by mouth daily. Patient not taking: Reported on 10/17/2017 08/14/16   Rolland Porter, MD  Multiple Vitamin (MULTIVITAMIN) tablet Take 1 tablet by mouth daily.    [provider]  vitamin B-12 (CYANOCOBALAMIN) 1000 MCG tablet Take 1,000 mcg by mouth daily.    [provider]  vitamin E 100 UNIT capsule Take 100 Units by mouth daily.    [provider]    Physical Exam: Vitals:   06/08/2020 1523 06-08-20 1600 06/08/20 1615 2020/06/08 1707  BP:  109/68 134/90 (!) 113/53  Pulse:  80 (!) 142 (!) 137  Resp:  (!) 32 20 (!) 33  Temp: 98.5 F (36.9 C)     TempSrc: Rectal     SpO2:  98% 98% 100%    Constitutional: NAD, calm, comfortable Vitals:   06/08/2020 1523 06/08/2020 1600 06-08-2020 1615 Jun 08, 2020 1707  BP:  109/68 134/90 (!) 113/53  Pulse:  80 (!) 142 (!) 137  Resp:  (!) 32 20 (!) 33  Temp: 98.5 F (36.9 C)  TempSrc: Rectal     SpO2:  98% 98% 100%   Eyes: PERRL, lids and conjunctivae normal ENMT: Mucous membranes are dry. Posterior pharynx clear of any exudate or lesions.Normal dentition.  Neck: normal, supple, no masses, no thyromegaly Respiratory: clear to auscultation bilaterally, no wheezing, no crackles. Normal respiratory effort. No accessory muscle use.  Cardiovascular: Regular rate and rhythm, no murmurs / rubs / gallops. No extremity edema. 2+ pedal pulses. No carotid bruits.  Abdomen: no tenderness, no masses palpated. No hepatosplenomegaly. Bowel sounds positive.  Musculoskeletal: no clubbing / cyanosis. Swelling and tender on left  groin area, left leg shortened and rotated.  1+ bilateral pedal pulses and good capillary refill on all toenails bilaterally Skin: no rashes, lesions, ulcers. No induration Neurologic: No facial droop, moving all limbs, following simple command psychiatric: Calm, confused   Labs on Admission: I have personally reviewed following labs and imaging studies  CBC: Recent Labs  Lab 2020/06/09 1301  WBC 20.4*  NEUTROABS 16.5*  HGB 9.3*  HCT 26.7*  MCV 86.4  PLT 177   Basic Metabolic Panel: Recent Labs  Lab 06-09-20 1301  NA 137  K 4.7  CL 105  CO2 17*  GLUCOSE 182*  BUN 20  CREATININE 1.49*  CALCIUM 8.4*   GFR: CrCl cannot be calculated (Unknown ideal weight.). Liver Function Tests: Recent Labs  Lab 06-09-20 1301  AST 49*  ALT 20  ALKPHOS 41  BILITOT 1.2  PROT 4.9*  ALBUMIN 3.2*   Recent Labs  Lab 06-09-2020 1301  LIPASE 24   No results for input(s): AMMONIA in the last 168 hours. Coagulation Profile: No results for input(s): INR, PROTIME in the last 168 hours. Cardiac Enzymes: Recent Labs  Lab 06-09-2020 1301  CKTOTAL 664*   BNP (last 3 results) No results for input(s): PROBNP in the last 8760 hours. HbA1C: No results for input(s): HGBA1C in the last 72 hours. CBG: No results for input(s): GLUCAP in the last 168 hours. Lipid Profile: No results for input(s): CHOL, HDL, LDLCALC, TRIG, CHOLHDL, LDLDIRECT in the last 72 hours. Thyroid Function Tests: No results for input(s): TSH, T4TOTAL, FREET4, T3FREE, THYROIDAB in the last 72 hours. Anemia Panel: No results for input(s): VITAMINB12, FOLATE, FERRITIN, TIBC, IRON, RETICCTPCT in the last 72 hours. Urine analysis: No results found for: COLORURINE, APPEARANCEUR, LABSPEC, PHURINE, GLUCOSEU, HGBUR, BILIRUBINUR, KETONESUR, PROTEINUR, UROBILINOGEN, NITRITE, LEUKOCYTESUR  Radiological Exams on Admission: DG Chest 2 View  Result Date: 09-Jun-2020 CLINICAL DATA:  87 year old female status post fall.  Left hip pain.  EXAM: CHEST - 2 VIEW COMPARISON:  Chest radiographs 10/17/2017 and earlier. FINDINGS: Semi upright AP and lateral views of the chest. Moderate to large chronic hiatal hernia with an air-fluid level today. Borderline cardiomegaly. Other mediastinal contours are within normal limits. Calcified aortic atherosclerosis. Large lung volumes. No pneumothorax, pulmonary edema, pleural effusion or acute pulmonary opacity. Osteopenia.  No acute osseous abnormality identified. IMPRESSION: 1. No acute cardiopulmonary abnormality. 2. Chronic pulmonary hyperinflation. Moderate to large chronic hiatal hernia. Aortic Atherosclerosis (ICD10-I70.0). Electronically Signed   By: Odessa Fleming M.D.   On: 06/09/20 11:54   CT Head Wo Contrast  Result Date: 06-09-2020 CLINICAL DATA:  Neck trauma, fall with accident in the bathroom. 87 year old female. EXAM: CT HEAD WITHOUT CONTRAST CT CERVICAL SPINE WITHOUT CONTRAST TECHNIQUE: Multidetector CT imaging of the head and cervical spine was performed following the standard protocol without intravenous contrast. Multiplanar CT image reconstructions of the cervical spine were also generated. COMPARISON:  MRI of the  brain from October of 2012 FINDINGS: CT HEAD FINDINGS Brain: No evidence of acute infarction, hemorrhage, hydrocephalus, extra-axial collection or mass lesion/mass effect. Marked atrophy and chronic microvascular ischemic changes. Findings are similar to exam from 2012. Vascular: No hyperdense vessel or unexpected calcification. Skull: Normal. Negative for fracture or focal lesion. Sinuses/Orbits: Mild LEFT maxillary and ethmoid sinus disease. Orbits without acute finding. Other: None CT CERVICAL SPINE FINDINGS Alignment: Assessment limited by gross patient motion. Particularly at the C2 level. Repeat attempted. Exaggerated lordotic curvature of the cervical spine. Likely positional. Skull base and vertebrae: Atlantooccipital joints with normal relationships. Lantus dens interval  grossly normal. Motion related artifact is significant about C2 despite repeat images. Posterior elements with normal relationships at this level no visible fracture on motion compromised images. Soft tissues and spinal canal: No prevertebral fluid or swelling. No visible canal hematoma. Disc levels: Multilevel spondylosis greatest C2-3 and C3-4 and C6-7. Upper chest: Negative. Other: None IMPRESSION: 1. No acute intracranial abnormality. 2. Marked atrophy and chronic microvascular ischemic changes of the brain, similar to exam from 2012. 3. Assessment of the cervical spine limited by gross patient motion. Particularly at the C2 level. Normal lesion sips of posterior elements and no visible fracture on coronal reconstructions. Limitations are present despite attempted repeat images. If there is high clinical suspicion for fracture images could be repeated though may yield similar results. 4. Multilevel spondylosis. Electronically Signed   By: Donzetta Kohut M.D.   On: 04/25/2020 15:44   CT Cervical Spine Wo Contrast  Result Date: 05/08/2020 CLINICAL DATA:  Neck trauma, fall with accident in the bathroom. 87 year old female. EXAM: CT HEAD WITHOUT CONTRAST CT CERVICAL SPINE WITHOUT CONTRAST TECHNIQUE: Multidetector CT imaging of the head and cervical spine was performed following the standard protocol without intravenous contrast. Multiplanar CT image reconstructions of the cervical spine were also generated. COMPARISON:  MRI of the brain from October of 2012 FINDINGS: CT HEAD FINDINGS Brain: No evidence of acute infarction, hemorrhage, hydrocephalus, extra-axial collection or mass lesion/mass effect. Marked atrophy and chronic microvascular ischemic changes. Findings are similar to exam from 2012. Vascular: No hyperdense vessel or unexpected calcification. Skull: Normal. Negative for fracture or focal lesion. Sinuses/Orbits: Mild LEFT maxillary and ethmoid sinus disease. Orbits without acute finding. Other: None  CT CERVICAL SPINE FINDINGS Alignment: Assessment limited by gross patient motion. Particularly at the C2 level. Repeat attempted. Exaggerated lordotic curvature of the cervical spine. Likely positional. Skull base and vertebrae: Atlantooccipital joints with normal relationships. Lantus dens interval grossly normal. Motion related artifact is significant about C2 despite repeat images. Posterior elements with normal relationships at this level no visible fracture on motion compromised images. Soft tissues and spinal canal: No prevertebral fluid or swelling. No visible canal hematoma. Disc levels: Multilevel spondylosis greatest C2-3 and C3-4 and C6-7. Upper chest: Negative. Other: None IMPRESSION: 1. No acute intracranial abnormality. 2. Marked atrophy and chronic microvascular ischemic changes of the brain, similar to exam from 2012. 3. Assessment of the cervical spine limited by gross patient motion. Particularly at the C2 level. Normal lesion sips of posterior elements and no visible fracture on coronal reconstructions. Limitations are present despite attempted repeat images. If there is high clinical suspicion for fracture images could be repeated though may yield similar results. 4. Multilevel spondylosis. Electronically Signed   By: Donzetta Kohut M.D.   On: 05/11/2020 15:44   CT ABDOMEN PELVIS W CONTRAST  Result Date: 05/11/2020 CLINICAL DATA:  Suspected diverticulitis. Status post fall with known LEFT hip  fracture. EXAM: CT ABDOMEN AND PELVIS WITH CONTRAST TECHNIQUE: Multidetector CT imaging of the abdomen and pelvis was performed using the standard protocol following bolus administration of intravenous contrast. CONTRAST:  75mL OMNIPAQUE IOHEXOL 300 MG/ML  SOLN COMPARISON:  October 03, 2003 FINDINGS: Lower chest: Hiatal hernia extending into the chest. No consolidation. No pleural effusion. Hepatobiliary: Normal appearance of hepatic parenchyma. Patent portal vein. No pericholecystic stranding. No biliary  duct dilation. Pancreas: Normal, without mass, inflammation or ductal dilatation. Spleen: Normal spleen. Adrenals/Urinary Tract: Adrenal glands with mild nodularity, otherwise unremarkable no hydronephrosis. Symmetric renal enhancement. Configuration urinary bladder implies pelvic floor dysfunction but is otherwise unremarkable. Stomach/Bowel: Large hiatal hernia. No acute gastrointestinal process. Colonic diverticulosis. Normal appendix. Vascular/Lymphatic: Extensive atheromatous plaque both calcified and noncalcified in the abdominal aorta extending in the iliac and femoral vasculature. Also extending into visceral branches. No aneurysmal dilation. Irregularity of LEFT superficial femoral artery with attenuation of contrast column and some stranding around the vessel in the setting of extensive LEFT lower extremity stranding following markedly displaced LEFT proximal femoral fracture. There is no gastrohepatic or hepatoduodenal ligament lymphadenopathy. No retroperitoneal or mesenteric lymphadenopathy. No pelvic sidewall lymphadenopathy. Reproductive: Unremarkable CT appearance of uterus and adnexa. Other: No ascites. Musculoskeletal: Marked Verus angulation of a comminuted LEFT intertrochanteric fracture with separate fragments of both greater and lesser trochanter. Marked stranding in the area and edema and hematoma tracking throughout musculature. Superficial femoral artery with irregularity and limited opacification, potentially occluded. Profunda femoral artery, visualized portions on the LEFT is patent. Expansion of musculature in the LEFT proximal thigh compatible with hematoma surrounding the fracture. IMPRESSION: 1. Signs of comminuted and displaced intertrochanteric fracture of the LEFT femur with adjacent LEFT superficial femoral artery showing long segment irregularity, wall thickening and limited opacification suspicious for vascular injury such as posttraumatic dissection though the area is  incompletely imaged. Suggest CT angiography of the LEFT lower extremity for further evaluation. There is vascular disease in this location i.e. atherosclerotic changes but these do not appear to account for the degree of abnormality seen on the current study. 2. Hematoma in the LEFT thigh tracking through muscular planes likely related to fracture. No visible extravasation though upper thigh is incompletely imaged. Attention to this area on subsequent imaging in the setting of suspected vascular injury. 3. Large hiatal hernia extending into the chest. 4. Colonic diverticulosis without evidence of acute diverticulitis. 5. Aortic atherosclerosis. These results were called by telephone at the time of interpretation on 05/02/2020 at 4:08 pm to provider [facility] CAC[facility] , who verbally acknowledged these results. Aortic Atherosclerosis (ICD10-I70.0). Electronically Signed   By: Donzetta KohutGeoffrey  Wile M.D.   On: 05/12/2020 16:03   DG HIP UNILAT WITH PELVIS 2-3 VIEWS LEFT  Result Date: 04/23/2020 CLINICAL DATA:  LEFT hip externally rotated unable to internally rotate. Status post fall in this 87 year old female EXAM: DG HIP (WITH OR WITHOUT PELVIS) 2-3V RIGHT; DG HIP (WITH OR WITHOUT PELVIS) 2-3V LEFT COMPARISON:  No recent comparisons. Prior CT of the abdomen and pelvis from 2005. FINDINGS: AP view of the pelvis without signs of fracture of the bony pelvis. Assessment mildly limited by stool and gas overlying the sacrum and iliac wings bilaterally. RIGHT hip: Osteopenia. No fracture about the RIGHT hip. Soft tissues are unremarkable. LEFT hip: Comminuted intratrochanteric fracture of the LEFT proximal femur with varus angulation. Standard lateral view not obtainable due to severity of the fracture. Oblique view provided. No cross-table lateral. IMPRESSION: 1. Comminuted intratrochanteric fracture of the LEFT hip with moderate  to marked varus angulation. 2. No fracture about the RIGHT hip or bony pelvis. 3. Osteopenia.  Electronically Signed   By: Donzetta Kohut M.D.   On: 04/24/2020 12:03   DG HIP UNILAT WITH PELVIS 2-3 VIEWS RIGHT  Result Date: 04/26/2020 CLINICAL DATA:  LEFT hip externally rotated unable to internally rotate. Status post fall in this 87 year old female EXAM: DG HIP (WITH OR WITHOUT PELVIS) 2-3V RIGHT; DG HIP (WITH OR WITHOUT PELVIS) 2-3V LEFT COMPARISON:  No recent comparisons. Prior CT of the abdomen and pelvis from 2005. FINDINGS: AP view of the pelvis without signs of fracture of the bony pelvis. Assessment mildly limited by stool and gas overlying the sacrum and iliac wings bilaterally. RIGHT hip: Osteopenia. No fracture about the RIGHT hip. Soft tissues are unremarkable. LEFT hip: Comminuted intratrochanteric fracture of the LEFT proximal femur with varus angulation. Standard lateral view not obtainable due to severity of the fracture. Oblique view provided. No cross-table lateral. IMPRESSION: 1. Comminuted intratrochanteric fracture of the LEFT hip with moderate to marked varus angulation. 2. No fracture about the RIGHT hip or bony pelvis. 3. Osteopenia. Electronically Signed   By: Donzetta Kohut M.D.   On: 05/12/2020 12:03    EKG: Independently reviewed. Sinus tachy  Assessment/Plan Active Problems:   Femoral fracture (HCC)   Hip fracture (HCC)  (please populate well all problems here in Problem List. (For example, if patient is on BP meds at home and you resume or decide to hold them, it is a problem that needs to be her. Same for CAD, COPD, HLD and so on)  Sepsis -Evidenced by elevated white count and lactic acid, no clear source yet unidentified.  With signs of endorgan damage of AKI as well as worsening of baseline confusion. -Vancomycin and cefepime, send a baseline as well repeat procalcitonin -MRSA screen and consider discontinue vancomycin if negative.  Question of left SFA dissection -Discussed with on-call vascular surgery Dr. Myra Gianotti, who decided to come down to see the  patient this evening to discuss further plans.  As per vascular surgery's opinion, given patient age, surgical intervention is of greater risk than benefits.  Will consider arterial Doppler. -Physical exam showed no compromise of left lower extremity blood supply and no symptoms or signs of compartment syndrome. -Discontinue the second CT angiogram of the left leg toady to avoid contrast nephropathy.  Vascular surgeon to see the patient tonight to decide further work-up plan. -D/W patient's daughter/POA at bedside, who understood and agreed with the plan.  Left hip fracture -Given the complicated issue of sepsis without clear source as well as SFA damage, discussed with orthopedic surgery who agreed to postpone ORIF at least for 24 hours.  AKI -Secondary to dehydration and sepsis -Received 2 L IV boluses and will continue high rate of IV hydration -Monitor kidney function for signs of contrast-induced nephropathy  Acute blood loss anemia -Repeat H&H tonight  Paroxysmal SVT -As needed beta-blocker  DVT prophylaxis: SCDCode Status: DNR/DNI Family Communication: Daughter at bedside Disposition Plan: Expect more than 2 midnight hospital stay for ORIF and physical therapy Consults called: Orthopedic surgery, vascular surgery Admission status: MedSurg   Emeline General MD [facility] Pager [phone]  04/24/2020, 5:20 PM

## 2020-05-13 NOTE — Sepsis Progress Note (Signed)
eLink monitoring this Code Sepsis. 

## 2020-05-13 NOTE — Sepsis Progress Note (Signed)
Notified bedside nurse of need to draw repeat lactic acid. [place] RN stated that she was occupied with another patient, but was able to call phlebotomy to get the repeat drawn.

## 2020-05-13 NOTE — ED Notes (Signed)
Purewick has been placed.  

## 2020-05-13 NOTE — Progress Notes (Signed)
[facility] Community Based Palliative [facility]       This patient is enrolled in our palliative care services in the community.  [facility] will continue to follow for any discharge planning needs and to coordinate continuation of palliative care.       Thank you for the opportunity to participate in this patient's care.     Chrislyn King, BSN, RN [facility] Hospital Liaison   [phone] (24 h on call)  

## 2020-05-14 ENCOUNTER — Encounter (HOSPITAL_COMMUNITY): Admission: EM | Disposition: E | Payer: Self-pay | Source: Home / Self Care | Attending: Internal Medicine

## 2020-05-14 ENCOUNTER — Inpatient Hospital Stay (HOSPITAL_COMMUNITY): Payer: Medicare Other

## 2020-05-14 DIAGNOSIS — Z789 Other specified health status: Secondary | ICD-10-CM

## 2020-05-14 DIAGNOSIS — W19XXXA Unspecified fall, initial encounter: Secondary | ICD-10-CM | POA: Diagnosis not present

## 2020-05-14 DIAGNOSIS — A419 Sepsis, unspecified organism: Secondary | ICD-10-CM | POA: Diagnosis not present

## 2020-05-14 DIAGNOSIS — R0902 Hypoxemia: Secondary | ICD-10-CM | POA: Diagnosis not present

## 2020-05-14 DIAGNOSIS — R52 Pain, unspecified: Secondary | ICD-10-CM

## 2020-05-14 DIAGNOSIS — R531 Weakness: Secondary | ICD-10-CM | POA: Diagnosis not present

## 2020-05-14 DIAGNOSIS — I724 Aneurysm of artery of lower extremity: Secondary | ICD-10-CM

## 2020-05-14 DIAGNOSIS — R0600 Dyspnea, unspecified: Secondary | ICD-10-CM

## 2020-05-14 DIAGNOSIS — I739 Peripheral vascular disease, unspecified: Secondary | ICD-10-CM

## 2020-05-14 DIAGNOSIS — J9601 Acute respiratory failure with hypoxia: Secondary | ICD-10-CM | POA: Diagnosis not present

## 2020-05-14 DIAGNOSIS — S72002A Fracture of unspecified part of neck of left femur, initial encounter for closed fracture: Principal | ICD-10-CM

## 2020-05-14 LAB — BASIC METABOLIC PANEL
Anion gap: 11 (ref 5–15)
BUN: 22 mg/dL (ref 8–23)
CO2: 19 mmol/L — ABNORMAL LOW (ref 22–32)
Calcium: 8.4 mg/dL — ABNORMAL LOW (ref 8.9–10.3)
Chloride: 106 mmol/L (ref 98–111)
Creatinine, Ser: 1.19 mg/dL — ABNORMAL HIGH (ref 0.44–1.00)
GFR, Estimated: 43 mL/min — ABNORMAL LOW (ref >60)
Glucose, Bld: 153 mg/dL — ABNORMAL HIGH (ref 70–99)
Potassium: 4.5 mmol/L (ref 3.5–5.1)
Sodium: 136 mmol/L (ref 135–145)

## 2020-05-14 LAB — PROCALCITONIN: Procalcitonin: 0.44 ng/mL

## 2020-05-14 LAB — CBC
HCT: 21.6 % — ABNORMAL LOW (ref 36.0–46.0)
Hemoglobin: 7.6 g/dL — ABNORMAL LOW (ref 12.0–15.0)
MCH: 30 pg (ref 26.0–34.0)
MCHC: 35.2 g/dL (ref 30.0–36.0)
MCV: 85.4 fL (ref 80.0–100.0)
Platelets: 211 10*3/uL (ref 150–400)
RBC: 2.53 MIL/uL — ABNORMAL LOW (ref 3.87–5.11)
RDW: 14 % (ref 11.5–15.5)
WBC: 24.2 10*3/uL — ABNORMAL HIGH (ref 4.0–10.5)
nRBC: 0 % (ref 0.0–0.2)

## 2020-05-14 SURGERY — FIXATION, FRACTURE, INTERTROCHANTERIC, WITH INTRAMEDULLARY ROD
Anesthesia: Choice | Laterality: Left

## 2020-05-14 MED ORDER — GLYCOPYRROLATE 0.2 MG/ML IJ SOLN: 0.2000 mg | INTRAMUSCULAR | Status: DC | PRN

## 2020-05-14 MED ORDER — POLYVINYL ALCOHOL 1.4 % OP SOLN
1.0000 [drp] | Freq: Four times a day (QID) | OPHTHALMIC | Status: DC | PRN
  Filled 2020-05-14: qty 15

## 2020-05-14 MED ORDER — FENTANYL CITRATE (PF) 100 MCG/2ML IJ SOLN
50.0000 ug | INTRAMUSCULAR | Status: DC | PRN
  Administered 2020-05-14 (×3): 50 ug via INTRAVENOUS
  Administered 2020-05-14: 100 ug via INTRAVENOUS
  Administered 2020-05-15: 50 ug via INTRAVENOUS
  Filled 2020-05-14 (×5): qty 2

## 2020-05-14 MED ORDER — SODIUM CHLORIDE 0.9 % IV BOLUS: 1000.0000 mL | Freq: Once | INTRAVENOUS | Status: DC

## 2020-05-14 MED ORDER — HALOPERIDOL LACTATE 5 MG/ML IJ SOLN: 2.0000 mg | Freq: Four times a day (QID) | INTRAMUSCULAR | Status: DC | PRN

## 2020-05-14 MED ORDER — SODIUM CHLORIDE 0.9 % IV BOLUS
500.0000 mL | Freq: Once | INTRAVENOUS | Status: AC
  Administered 2020-05-14: 500 mL via INTRAVENOUS

## 2020-05-14 MED ORDER — ACETAMINOPHEN 650 MG RE SUPP: 650.0000 mg | RECTAL | Status: DC | PRN

## 2020-05-14 MED ORDER — NALOXONE HCL 0.4 MG/ML IJ SOLN
0.4000 mg | Freq: Once | INTRAMUSCULAR | Status: AC
  Administered 2020-05-14: 0.4 mg via INTRAVENOUS
  Filled 2020-05-14: qty 1

## 2020-05-14 MED ORDER — BIOTENE DRY MOUTH MT LIQD
15.0000 mL | Freq: Two times a day (BID) | OROMUCOSAL | Status: DC
  Administered 2020-05-14 – 2020-05-16 (×5): 15 mL via TOPICAL

## 2020-05-14 MED ORDER — GLYCOPYRROLATE 1 MG PO TABS
1.0000 mg | ORAL_TABLET | ORAL | Status: DC | PRN
  Filled 2020-05-14: qty 1

## 2020-05-14 MED ORDER — GLYCOPYRROLATE 0.2 MG/ML IJ SOLN
0.2000 mg | INTRAMUSCULAR | Status: DC | PRN
  Administered 2020-05-15 – 2020-05-16 (×7): 0.2 mg via INTRAVENOUS
  Filled 2020-05-14 (×7): qty 1

## 2020-05-14 MED ORDER — ONDANSETRON 4 MG PO TBDP: 4.0000 mg | ORAL_TABLET | Freq: Four times a day (QID) | ORAL | Status: DC | PRN

## 2020-05-14 MED ORDER — ONDANSETRON HCL 4 MG/2ML IJ SOLN: 4.0000 mg | Freq: Four times a day (QID) | INTRAMUSCULAR | Status: DC | PRN

## 2020-05-14 MED ORDER — KETOROLAC TROMETHAMINE 15 MG/ML IJ SOLN
7.5000 mg | Freq: Once | INTRAMUSCULAR | Status: AC | PRN
  Administered 2020-05-14: 7.5 mg via INTRAVENOUS
  Filled 2020-05-14: qty 1

## 2020-05-14 MED ORDER — LORAZEPAM 2 MG/ML IJ SOLN
1.0000 mg | INTRAMUSCULAR | Status: DC | PRN
  Administered 2020-05-14 – 2020-05-15 (×3): 1 mg via INTRAVENOUS
  Filled 2020-05-14 (×4): qty 1

## 2020-05-14 MED ORDER — HALOPERIDOL LACTATE 2 MG/ML PO CONC
2.0000 mg | Freq: Four times a day (QID) | ORAL | Status: DC | PRN
  Filled 2020-05-14: qty 1

## 2020-05-14 NOTE — ED Notes (Signed)
RN attempted to call report 

## 2020-05-14 NOTE — ED Notes (Addendum)
Repeated shoulder shrugging, chin raising, grimacing movement noted throughout night. Adjustments for comfort and medication for pain have reduced these movements some, but still persist even when pt is resting. Pt is arousable to voice but confused. Denies pain when asked. Will continue to monitor.

## 2020-05-14 NOTE — Progress Notes (Signed)
Patient transferred from ED to 6 north via stretcher accompanied by family. Patient is comfort care and family wishes to not perform and Vital signs or invasive procedures for patient. They request that patient be made as comfortable as possible.

## 2020-05-14 NOTE — Progress Notes (Addendum)
PROGRESS NOTE    Kimberly Mcconnell  KKX:[id number] DOB: 20-Jul-1924 DOA: 04/19/2020 PCP: Lewis Moccasin, MD   Chief Complaint  Patient presents with   Fall   Weakness    Brief Narrative: 87 year old female with history of paroxysmal SVT advanced dementia osteoporosis poor historian at baseline presented to ED 04/22/2020 after an witnessed fall during the night on 1/25.   As per the report- she was last seen normal 1/25 afternoon by home care service.  Patient then was found naked on the bathroom floor, confused.  Likely patient had a unwitnessed fall sometime 1/25 night.  But due to dementia and confusion patient cannot provide further information. In ED- X-ray showed left hip fracture.  Patient was found to be very tachycardia, lactic acid 7.6, creatinine 1.49 compared to baseline around 1.0.  WBC 20.  No clear source of infection was identified, CT abdomen pelvis with contrast showed large left groin hematoma and questionable SFA dissection/injury Vascular and orthopedic were consulted. Patient suspected to have a severe sepsis  Subjective:  Seen urgently this am Daughter POA at bedside- states having trouble ot get BP and eating, agitated and needing pain meds helping her calm down. At baseline was ambulatory, but confused, able to recognize only daughter but does not rememeber things form that moment to the next- peor short term memory, has advanced dementia and progressing, was following palliative care. Briefly opened her eyes and closed again  Manual BP in 70s- 500 ml bolus and lactic acid ordered Wbc trending up [place] pending   Assessment & Plan:  SIRS w/ hypotension and lactic acidosis: Unclear source, with significantly elevated white count lactic acid and hypotensive this morning.  On empiric vancomycin and cefepime.  No obvious pneumonia on chest x-ray, UA large le, neg nitrite WBC 0-5 not impressive enough for UTI.  Procalcitonin elevated 0.4, COVID-19 negative.  Giving bolus  500 ml NS this morning also Narcan x1.  Trend lactic acid monitor vitals.  May need pressors if family agrees-awaiting family decision Recent Labs  Lab 04/19/2020 1253 05/12/2020 1301 05/16/2020 1903 05/12/2020 0015  WBC  --  20.4*  --  24.2*  LATICACIDVEN 7.6*  --  4.6*  --   PROCALCITON  --   --   --  0.44   Acute hypoxic respiratory failure unclear etiology patient is confused chest x-ray no acute finding.  Could be in the setting of opiate use with AKI, tried Narcan x1 did woke her up and oxygen improved tried on nonrebreather this morning hopefully we can wean off.  Monitor closely.  She is DNR/DNI.  Left hip fracture orthopedic is already involved at this time delaying surgery due to severe sepsis and other issues, not stable for surgery yet.  She is DNR/DNI.   AKI on CKD stage IIIa: Creatinine does appear better this morning.  Continue on IV fluids avoid hypotension.  Likely from a diarrhea at home Recent Labs  Lab 05/11/2020 1301 05/04/2020 0015  BUN 20 22  CREATININE 1.49* 1.19*   Mild rhabdomyolysis CK in 600, likely from laying on the floor nontraumatic, repeat CK level, continue IV fluids  Diarrhea at home, unknown period: Monitor if he has any issues can check for GI panel.  ?lt SFA Dissection in CTA : Lower extremities warm- left foot colder than art us/abi pending.Vascular surgery involved  Anemia likely anemia of chronic disease and acute blood loss anemia form hip fracture: hemoglobin at 7.6 g this morning monitor and transfuse if less than  7 g. Recent Labs  Lab 04/22/2020 1301 04/26/2020 0015  HGB 9.3* 7.6*  HCT 26.7* 21.6*   PSVT monitor  Acute metabolic encephalopathy in the setting of advanced dementia at baseline very poor short-term memory oriented x1-2 but ambulatory.  DNR/DNI. Currently minimally responsive.  Goals of care:Confirmed DNR/DNI.  Patient appears very ill, this morning hypotensive hypoxic with severe sepsis.  Had extensive discussion with patient's  daughter POA at the bedside and palliative care is also consulted.  Daughter is going to discuss with brothers about how to move forward , ifthe patient does not improve question if hospice is beneficial, palliative will be seeing patient  this morning.  Nutrition: Diet Order            Diet NPO time specified  Diet effective now                 There is no height or weight on file to calculate BMI.  DVT prophylaxis: SCDs Start: 05/12/2020 1710 Code Status:   Code Status: DNR  Family Communication: plan of care discussed with patient's daughter at bedside.  Status is: Inpatient Remains inpatient appropriate because:Inpatient level of care appropriate due to severity of illness and Ongoing management of severe sepsis hip fracture AKI, anemia  Dispo: The patient is from: Home              Anticipated d/c is to: TBD              Anticipated d/c date is: 3 days              Patient currently is not medically stable to d/c.   Difficult to place patient No  CRITICAL CARE Performed by: Lanae Boastamesh Nayara Taplin   Total critical care time: 42 minutes  Critical care time was exclusive of separately billable procedures and treating other patients.  Critical care was necessary to treat or prevent imminent or life-threatening deterioration.  Critical care was time spent personally by me on the following activities: development of treatment plan with patient and/or surrogate as well as nursing, discussions with consultants, evaluation of patient's response to treatment, examination of patient, obtaining history from patient or surrogate, ordering and performing treatments and interventions, ordering and review of laboratory studies, ordering and review of radiographic studies, pulse oximetry and re-evaluation of patient's condition.  Consultants:see note  Procedures:see note  Culture/Microbiology No results found for: SDES, SPECREQUEST, CULT, REPTSTATUS  Other culture-see note  Medications: Scheduled  Meds:  chlorhexidine  60 mL Topical Once   cholecalciferol  400 Units Oral Daily   multivitamin with minerals  1 tablet Oral Daily   povidone-iodine  2 application Topical Once   vitamin B-12  1,000 mcg Oral Daily   vitamin E  100 Units Oral Daily   Continuous Infusions:  sodium chloride Stopped (04/27/2020 2156)    ceFAZolin (ANCEF) IV Stopped (05/15/2020 16100629)   ceFEPime (MAXIPIME) IV     [START ON 05/15/2020] vancomycin      Antimicrobials: Anti-infectives (From admission, onward)   Start     Dose/Rate Route Frequency Ordered Stop   05/15/20 1400  vancomycin (VANCOREADY) IVPB 750 mg/150 mL        750 mg 150 mL/hr over 60 Minutes Intravenous Every 48 hours 05/16/2020 1416     05/05/2020 1400  ceFEPIme (MAXIPIME) 2 g in sodium chloride 0.9 % 100 mL IVPB        2 g 200 mL/hr over 30 Minutes Intravenous Every 24 hours 04/26/2020 1416  05/07/2020 0600  ceFAZolin (ANCEF) IVPB 2g/100 mL premix        2 g 200 mL/hr over 30 Minutes Intravenous On call to [facility]. 12-Jun-2020 2122 05/15/20 0559   06-12-20 1400  ceFEPIme (MAXIPIME) 2 g in sodium chloride 0.9 % 100 mL IVPB        2 g 200 mL/hr over 30 Minutes Intravenous  Once June 12, 2020 1356 Jun 12, 2020 1612   2020/06/12 1400  metroNIDAZOLE (FLAGYL) IVPB 500 mg        500 mg 100 mL/hr over 60 Minutes Intravenous  Once 06/12/2020 1356 June 12, 2020 1806   12-Jun-2020 1400  vancomycin (VANCOCIN) IVPB 1000 mg/200 mL premix        1,000 mg 200 mL/hr over 60 Minutes Intravenous  Once 2020-06-12 1356 06-12-20 2120     Objective: Vitals: Today's Vitals   05/12/2020 0100 04/23/2020 0350 04/21/2020 0723 05/16/2020 0815  BP: (!) 98/46 (!) 120/98 (!) 81/43 97/82  Pulse: 91 (!) 117 76 69  Resp: (!) 38 (!) 32 17 20  Temp:      TempSrc:      SpO2: 90% 92% (!) 85% 95%  PainSc:        Intake/Output Summary (Last 24 hours) at 05/16/2020 0959 Last data filed at 05/18/2020 0900 Gross per 24 hour  Intake 2100 ml  Output 200 ml  Net 1900 ml   There were no vitals filed for  this visit. Weight change:   Intake/Output from previous day: 01/26 0701 - 01/27 0700 In: 1600 [IV Piggyback:1600] Out: 200 [Urine:200] Intake/Output this shift: Total I/O In: 500 [IV Piggyback:500] Out: -  There were no vitals filed for this visit.  Examination: General exam: Minimally responsive briefly opened eyes, facial grimacing at times HEENT:Oral mucosa moist, Ear/Nose WNL grossly,dentition normal. Respiratory system: bilaterally CLEAR,no wheezing or crackles,no use of accessory muscle, non tender. Cardiovascular system: S1 & S2 +, regular, No JVD. Gastrointestinal system: Abdomen soft, NT,ND, BS+. Nervous System: Confused, minimally responsive Extremities: No edema, distal peripheral pulses palpable.  Skin: No rashes,no icterus. MSK: Normal muscle bulk,tone, power  Data Reviewed: I have personally reviewed following labs and imaging studies CBC: Recent Labs  Lab 12-Jun-2020 1301 04/25/2020 0015  WBC 20.4* 24.2*  NEUTROABS 16.5*  --   HGB 9.3* 7.6*  HCT 26.7* 21.6*  MCV 86.4 85.4  PLT 177 211   Basic Metabolic Panel: Recent Labs  Lab 06/12/20 1301 05/01/2020 0015  NA 137 136  K 4.7 4.5  CL 105 106  CO2 17* 19*  GLUCOSE 182* 153*  BUN 20 22  CREATININE 1.49* 1.19*  CALCIUM 8.4* 8.4*   GFR: CrCl cannot be calculated (Unknown ideal weight.). Liver Function Tests: Recent Labs  Lab 06-12-20 1301  AST 49*  ALT 20  ALKPHOS 41  BILITOT 1.2  PROT 4.9*  ALBUMIN 3.2*   Recent Labs  Lab 2020-06-12 1301  LIPASE 24   No results for input(s): AMMONIA in the last 168 hours. Coagulation Profile: No results for input(s): INR, PROTIME in the last 168 hours. Cardiac Enzymes: Recent Labs  Lab 06/12/2020 1301  CKTOTAL 664*   BNP (last 3 results) No results for input(s): PROBNP in the last 8760 hours. HbA1C: No results for input(s): HGBA1C in the last 72 hours. CBG: No results for input(s): GLUCAP in the last 168 hours. Lipid Profile: No results for input(s):  CHOL, HDL, LDLCALC, TRIG, CHOLHDL, LDLDIRECT in the last 72 hours. Thyroid Function Tests: No results for input(s): TSH, T4TOTAL, FREET4, T3FREE, THYROIDAB in  the last 72 hours. Anemia Panel: No results for input(s): VITAMINB12, FOLATE, FERRITIN, TIBC, IRON, RETICCTPCT in the last 72 hours. Sepsis Labs: Recent Labs  Lab 16-May-2020 1253 2020-05-16 1903 05/12/2020 0015  PROCALCITON  --   --  0.44  LATICACIDVEN 7.6* 4.6*  --     Recent Results (from the past 240 hour(s))  SARS Coronavirus 2 by RT PCR (hospital order, performed in [facility] hospital lab) Nasopharyngeal Nasopharyngeal Swab     Status: None   Collection Time: 2020-05-16  4:15 PM   Specimen: Nasopharyngeal Swab  Result Value Ref Range Status   SARS Coronavirus 2 NEGATIVE NEGATIVE Final    Comment: (NOTE) SARS-CoV-2 target nucleic acids are NOT DETECTED.  The SARS-CoV-2 RNA is generally detectable in upper and lower respiratory specimens during the acute phase of infection. The lowest concentration of SARS-CoV-2 viral copies this assay can detect is 250 copies / mL. A negative result does not preclude SARS-CoV-2 infection and should not be used as the sole basis for treatment or other patient management decisions.  A negative result may occur with improper specimen collection / handling, submission of specimen other than nasopharyngeal swab, presence of viral mutation(s) within the areas targeted by this assay, and inadequate number of viral copies (<250 copies / mL). A negative result must be combined with clinical observations, patient history, and epidemiological information.  Fact Sheet for Patients:   BoilerBrush.com.cy  Fact Sheet for Healthcare Providers: https://pope.com/  This test is not yet approved or  cleared by the [place] FDA and has been authorized for detection and/or diagnosis of SARS-CoV-2 by FDA under an Emergency Use Authorization (EUA).  This EUA  will remain in effect (meaning this test can be used) for the duration of the COVID-19 declaration under Section 564(b)(1) of the Act, 21 U.S.C. section 360bbb-3(b)(1), unless the authorization is terminated or revoked sooner.  Performed at [facility], [address], [place], [place] [address]      Radiology Studies: DG Chest 2 View  Result Date: May 16, 2020 CLINICAL DATA:  87 year old female status post fall.  Left hip pain. EXAM: CHEST - 2 VIEW COMPARISON:  Chest radiographs 10/17/2017 and earlier. FINDINGS: Semi upright AP and lateral views of the chest. Moderate to large chronic hiatal hernia with an air-fluid level today. Borderline cardiomegaly. Other mediastinal contours are within normal limits. Calcified aortic atherosclerosis. Large lung volumes. No pneumothorax, pulmonary edema, pleural effusion or acute pulmonary opacity. Osteopenia.  No acute osseous abnormality identified. IMPRESSION: 1. No acute cardiopulmonary abnormality. 2. Chronic pulmonary hyperinflation. Moderate to large chronic hiatal hernia. Aortic Atherosclerosis (ICD10-I70.0). Electronically Signed   By: Odessa Fleming M.D.   On: 2020/05/16 11:54   CT Head Wo Contrast  Result Date: 05/16/20 CLINICAL DATA:  Neck trauma, fall with accident in the bathroom. 87 year old female. EXAM: CT HEAD WITHOUT CONTRAST CT CERVICAL SPINE WITHOUT CONTRAST TECHNIQUE: Multidetector CT imaging of the head and cervical spine was performed following the standard protocol without intravenous contrast. Multiplanar CT image reconstructions of the cervical spine were also generated. COMPARISON:  MRI of the brain from October of 2012 FINDINGS: CT HEAD FINDINGS Brain: No evidence of acute infarction, hemorrhage, hydrocephalus, extra-axial collection or mass lesion/mass effect. Marked atrophy and chronic microvascular ischemic changes. Findings are similar to exam from 2012. Vascular: No hyperdense vessel or unexpected calcification. Skull: Normal.  Negative for fracture or focal lesion. Sinuses/Orbits: Mild LEFT maxillary and ethmoid sinus disease. Orbits without acute finding. Other: None CT CERVICAL SPINE FINDINGS  Alignment: Assessment limited by gross patient motion. Particularly at the C2 level. Repeat attempted. Exaggerated lordotic curvature of the cervical spine. Likely positional. Skull base and vertebrae: Atlantooccipital joints with normal relationships. Lantus dens interval grossly normal. Motion related artifact is significant about C2 despite repeat images. Posterior elements with normal relationships at this level no visible fracture on motion compromised images. Soft tissues and spinal canal: No prevertebral fluid or swelling. No visible canal hematoma. Disc levels: Multilevel spondylosis greatest C2-3 and C3-4 and C6-7. Upper chest: Negative. Other: None IMPRESSION: 1. No acute intracranial abnormality. 2. Marked atrophy and chronic microvascular ischemic changes of the brain, similar to exam from 2012. 3. Assessment of the cervical spine limited by gross patient motion. Particularly at the C2 level. Normal lesion sips of posterior elements and no visible fracture on coronal reconstructions. Limitations are present despite attempted repeat images. If there is high clinical suspicion for fracture images could be repeated though may yield similar results. 4. Multilevel spondylosis. Electronically Signed   By: Donzetta Kohut M.D.   On: 05/12/2020 15:44   CT Cervical Spine Wo Contrast  Result Date: 05/11/2020 CLINICAL DATA:  Neck trauma, fall with accident in the bathroom. 87 year old female. EXAM: CT HEAD WITHOUT CONTRAST CT CERVICAL SPINE WITHOUT CONTRAST TECHNIQUE: Multidetector CT imaging of the head and cervical spine was performed following the standard protocol without intravenous contrast. Multiplanar CT image reconstructions of the cervical spine were also generated. COMPARISON:  MRI of the brain from October of 2012 FINDINGS: CT HEAD  FINDINGS Brain: No evidence of acute infarction, hemorrhage, hydrocephalus, extra-axial collection or mass lesion/mass effect. Marked atrophy and chronic microvascular ischemic changes. Findings are similar to exam from 2012. Vascular: No hyperdense vessel or unexpected calcification. Skull: Normal. Negative for fracture or focal lesion. Sinuses/Orbits: Mild LEFT maxillary and ethmoid sinus disease. Orbits without acute finding. Other: None CT CERVICAL SPINE FINDINGS Alignment: Assessment limited by gross patient motion. Particularly at the C2 level. Repeat attempted. Exaggerated lordotic curvature of the cervical spine. Likely positional. Skull base and vertebrae: Atlantooccipital joints with normal relationships. Lantus dens interval grossly normal. Motion related artifact is significant about C2 despite repeat images. Posterior elements with normal relationships at this level no visible fracture on motion compromised images. Soft tissues and spinal canal: No prevertebral fluid or swelling. No visible canal hematoma. Disc levels: Multilevel spondylosis greatest C2-3 and C3-4 and C6-7. Upper chest: Negative. Other: None IMPRESSION: 1. No acute intracranial abnormality. 2. Marked atrophy and chronic microvascular ischemic changes of the brain, similar to exam from 2012. 3. Assessment of the cervical spine limited by gross patient motion. Particularly at the C2 level. Normal lesion sips of posterior elements and no visible fracture on coronal reconstructions. Limitations are present despite attempted repeat images. If there is high clinical suspicion for fracture images could be repeated though may yield similar results. 4. Multilevel spondylosis. Electronically Signed   By: Donzetta Kohut M.D.   On: 05/12/2020 15:44   CT ABDOMEN PELVIS W CONTRAST  Result Date: 04/30/2020 CLINICAL DATA:  Suspected diverticulitis. Status post fall with known LEFT hip fracture. EXAM: CT ABDOMEN AND PELVIS WITH CONTRAST TECHNIQUE:  Multidetector CT imaging of the abdomen and pelvis was performed using the standard protocol following bolus administration of intravenous contrast. CONTRAST:  58mL OMNIPAQUE IOHEXOL 300 MG/ML  SOLN COMPARISON:  October 03, 2003 FINDINGS: Lower chest: Hiatal hernia extending into the chest. No consolidation. No pleural effusion. Hepatobiliary: Normal appearance of hepatic parenchyma. Patent portal vein. No pericholecystic stranding. No  biliary duct dilation. Pancreas: Normal, without mass, inflammation or ductal dilatation. Spleen: Normal spleen. Adrenals/Urinary Tract: Adrenal glands with mild nodularity, otherwise unremarkable no hydronephrosis. Symmetric renal enhancement. Configuration urinary bladder implies pelvic floor dysfunction but is otherwise unremarkable. Stomach/Bowel: Large hiatal hernia. No acute gastrointestinal process. Colonic diverticulosis. Normal appendix. Vascular/Lymphatic: Extensive atheromatous plaque both calcified and noncalcified in the abdominal aorta extending in the iliac and femoral vasculature. Also extending into visceral branches. No aneurysmal dilation. Irregularity of LEFT superficial femoral artery with attenuation of contrast column and some stranding around the vessel in the setting of extensive LEFT lower extremity stranding following markedly displaced LEFT proximal femoral fracture. There is no gastrohepatic or hepatoduodenal ligament lymphadenopathy. No retroperitoneal or mesenteric lymphadenopathy. No pelvic sidewall lymphadenopathy. Reproductive: Unremarkable CT appearance of uterus and adnexa. Other: No ascites. Musculoskeletal: Marked Verus angulation of a comminuted LEFT intertrochanteric fracture with separate fragments of both greater and lesser trochanter. Marked stranding in the area and edema and hematoma tracking throughout musculature. Superficial femoral artery with irregularity and limited opacification, potentially occluded. Profunda femoral artery, visualized  portions on the LEFT is patent. Expansion of musculature in the LEFT proximal thigh compatible with hematoma surrounding the fracture. IMPRESSION: 1. Signs of comminuted and displaced intertrochanteric fracture of the LEFT femur with adjacent LEFT superficial femoral artery showing long segment irregularity, wall thickening and limited opacification suspicious for vascular injury such as posttraumatic dissection though the area is incompletely imaged. Suggest CT angiography of the LEFT lower extremity for further evaluation. There is vascular disease in this location i.e. atherosclerotic changes but these do not appear to account for the degree of abnormality seen on the current study. 2. Hematoma in the LEFT thigh tracking through muscular planes likely related to fracture. No visible extravasation though upper thigh is incompletely imaged. Attention to this area on subsequent imaging in the setting of suspected vascular injury. 3. Large hiatal hernia extending into the chest. 4. Colonic diverticulosis without evidence of acute diverticulitis. 5. Aortic atherosclerosis. These results were called by telephone at the time of interpretation on Jun 08, 2020 at 4:08 pm to provider [facility] , who verbally acknowledged these results. Aortic Atherosclerosis (ICD10-I70.0). Electronically Signed   By: Donzetta Kohut M.D.   On: 2020-06-08 16:03   DG Chest Port 1 View  Result Date: 05/11/2020 CLINICAL DATA:  87 year old female with weakness and hypoxia. EXAM: PORTABLE CHEST 1 VIEW COMPARISON:  Chest radiographs 08-Jun-2020 and earlier. FINDINGS: Portable AP semi upright view at 0814 hours. Moderate size hiatal hernia again noted. Other mediastinal contours are within normal limits. Visualized tracheal air column is within normal limits. Stable lung volumes. Allowing for portable technique the lungs are clear. Visible bowel gas pattern in the upper abdomen within normal limits. Stable visualized osseous structures.  IMPRESSION: No acute cardiopulmonary abnormality.  Hiatal hernia. Electronically Signed   By: Odessa Fleming M.D.   On: 05/09/2020 08:28   DG HIP UNILAT WITH PELVIS 2-3 VIEWS LEFT  Result Date: 06-08-2020 CLINICAL DATA:  LEFT hip externally rotated unable to internally rotate. Status post fall in this 87 year old female EXAM: DG HIP (WITH OR WITHOUT PELVIS) 2-3V RIGHT; DG HIP (WITH OR WITHOUT PELVIS) 2-3V LEFT COMPARISON:  No recent comparisons. Prior CT of the abdomen and pelvis from 2005. FINDINGS: AP view of the pelvis without signs of fracture of the bony pelvis. Assessment mildly limited by stool and gas overlying the sacrum and iliac wings bilaterally. RIGHT hip: Osteopenia. No fracture about the RIGHT hip. Soft tissues are unremarkable. LEFT  hip: Comminuted intratrochanteric fracture of the LEFT proximal femur with varus angulation. Standard lateral view not obtainable due to severity of the fracture. Oblique view provided. No cross-table lateral. IMPRESSION: 1. Comminuted intratrochanteric fracture of the LEFT hip with moderate to marked varus angulation. 2. No fracture about the RIGHT hip or bony pelvis. 3. Osteopenia. Electronically Signed   By: Donzetta Kohut M.D.   On: 05/06/2020 12:03   DG HIP UNILAT WITH PELVIS 2-3 VIEWS RIGHT  Result Date: 05/03/2020 CLINICAL DATA:  LEFT hip externally rotated unable to internally rotate. Status post fall in this 87 year old female EXAM: DG HIP (WITH OR WITHOUT PELVIS) 2-3V RIGHT; DG HIP (WITH OR WITHOUT PELVIS) 2-3V LEFT COMPARISON:  No recent comparisons. Prior CT of the abdomen and pelvis from 2005. FINDINGS: AP view of the pelvis without signs of fracture of the bony pelvis. Assessment mildly limited by stool and gas overlying the sacrum and iliac wings bilaterally. RIGHT hip: Osteopenia. No fracture about the RIGHT hip. Soft tissues are unremarkable. LEFT hip: Comminuted intratrochanteric fracture of the LEFT proximal femur with varus angulation. Standard  lateral view not obtainable due to severity of the fracture. Oblique view provided. No cross-table lateral. IMPRESSION: 1. Comminuted intratrochanteric fracture of the LEFT hip with moderate to marked varus angulation. 2. No fracture about the RIGHT hip or bony pelvis. 3. Osteopenia. Electronically Signed   By: Donzetta Kohut M.D.   On: 05/04/2020 12:03     LOS: 1 day   Lanae Boast, MD [facility]  Jun 04, 2020, 9:59 AM

## 2020-05-14 NOTE — Accreditation Note (Signed)
Area of concern in the left groin was evaluated with ultrasound this morning.  It potentially could represent dissection or chronic plaque.  ABIs could not be performed secondary to calcification.  Toe pressures were absent.  The has transition to palliative care.  Therefore no vascular intervention has been discussed.  I will be available as needed I will not actively follow the patient  Durene Cal [phone]

## 2020-05-14 NOTE — Progress Notes (Signed)
VASCULAR LAB    Left groin duplex has been performed.  See CV proc for preliminary results.   Aedon Deason, RVT 05/13/2020, 10:33 AM

## 2020-05-14 NOTE — Progress Notes (Signed)
Nutrition Brief Note  Chart reviewed. Pt now transitioning to comfort care.  No further nutrition interventions warranted at this time.  Please re-consult as needed.   Abhijay Morriss W, RD, LDN, CDCES Registered Dietitian II Certified Diabetes Care and Education Specialist Please refer to [facility] for RD and/or RD on-call/weekend/after hours pager  

## 2020-05-14 NOTE — Progress Notes (Addendum)
This chaplain responded to PMT consult for spiritual care in the ED.  The Pt is resting comfortably with the  Pt. daughter-Barbara at the bedside. The Pt. Is full comfort care with an expected hospital death. Kimberly Mcconnell hopes the Pt. will be moved to a private room.  Story telling with Kimberly Mcconnell captured the Pt. Catholic faith, love of music and dancing, and joy of being with people.   This chaplain understands Father Magnus Ivan will visit the Pt. today for "Annointing of the Sick."  Kimberly Mcconnell and the chaplain shared the Lord's Prayer with the Pt.   This chaplain is available for F/U spiritual care is available as needed.

## 2020-05-14 NOTE — Consult Note (Incomplete)
Consultation Note Date: 04/28/2020   Patient Name: Kimberly Mcconnell  DOB: Mar 06, 1925  MRN: [id number]  Age / Sex: 87 y.o., female  PCP: Fanny Bien, MD Referring Physician: Antonieta Pert, MD  Reason for Consultation: Establishing goals of care  HPI/Patient Profile: 87 y.o. female  with past medical history of osteoporosis, advanced dementia, and SVT presented to the ED on 04/19/2020 from [facility] independent living after caregiver found her on the floor of her home covered in feces - unsure how long down. In ED patient was found to have a hip fracture but main concern was for sepsis. Incidental left groin hematoma and questionable SFA injury/disection was noted per CT. Patient was admitted on 04/23/2020 with sepsis, question of left SFA dissection, left hip fracture, AKI, and acute blood loss anemia.  ED Course: X-ray showed left hip fracture.  Patient was found to be very tachycardia, lactic acid 7.6, creatinine 1.49 compared to baseline around 1.0.  WBC 20.  No clear source of infection was identified, CT abdomen pelvis with contrast showed large left groin hematoma and questionable SFA dissection/injury.  Of note, Ms. Capelle has been followed by [facility] outpatient Palliative Care.  Clinical Assessment and Goals of Care: I have reviewed medical records including EPIC notes, labs, and imaging. Received report from primary RN - acute concern that patient is critically ill. RN states patient was comfortable on dilaudid; however, patient became hypotensive and subsequently was given a dose of narcan. RN states patient seems very uncomfortable now and remains hypotensive and critically ill.    PMT received several calls from providers requesting PMT prioritize patient due to her critical state and concern for high risk decompensation.   Received updates from Dr. Lupita Leash via phone.  Went to visit patient at bedside  - daughter/Barbara was present. Patient was lying in bed awake and alert - she was not able to participate in conversation verbally but seemed to understand conversation around her as she would nod "yes" and "no" to questions. Non-verbal gestures of pain and discomfort were noted along with increased work of breathing. Patient overall looked very uncomfortable and restless. No respiratory distress or secretions were noted. She was on non-rebreather during visit and remained hypotensive on monitor.   Met with daughter/Barbara  to discuss diagnosis, prognosis, GOC, EOL wishes, disposition, and options.  I introduced Palliative Medicine as specialized medical care for people living with serious illness. It focuses on providing relief from the symptoms and stress of a serious illness. The goal is to improve quality of life for both the patient and the family.  We discussed a brief life review of the patient as well as functional and nutritional status. Prior to this hospitalization, Ms. Cannady was living at an independent living facility. She was able to ambulate independently but did require supportive assistance from caregivers several hours each day for ADLs. [name] states the patient was also eating and drinking well prior to this incident.   We discussed patient's current illness and what it means in the  larger context of patient's on-going co-morbidities. [name] had a clear understanding of the patient's current acute medical situation but states she was still waiting for updates from medical team. I was able to provide [name] with updates/recommendations from medical team as outlined per their notes and my discussions with them. Natural disease trajectory and expectations at EOL were discussed. I attempted to elicit values and goals of care important to the patient. The difference between aggressive medical intervention and comfort care was considered in light of the patient's goals of care. [name]  tells me that she does not want the patient to suffer. She states she has been in contact with her siblings and they were all ok with moving toward full comfort measures; however, tearfully [name] stated she "wanted to make sure it was the right decision." I could tell the decision was weighing heavy on her as she seemed overwhelmed and anxious. I discussed with her that there are no "right or wrong" answers, but as long as decisions are made with Ms. Bushnell's goals and wishes in mind, that is what matters. I provided emotional support and validation that these are very hard decisions to make. We discussed through conversations she had previously had with [name], which per [name], patient would not want prolonged aggressive interventions. As we discussed this topic, Ms. Chipman shook her head "no" to agree she would not want this. [name] asked Ms. Delao if she was "ready to go" be with her husband and to be "kept comfortable now," in which Ms. Sarwar shook her head "yes." I expressed again that as long as [name] made decisions around Ms. Poli's goals, they were not wrong decisions but loving and kind ones. [name] seemed to accept transition to full comfort care at that time. She asked to call her husband/Eddie - I encouraged her to reach out to whomever she felt needed to join our discussion.   [name] called her husband/Eddie and placed him on speakerphone. I briefly reviewed information with him as outlined above. We talked about transition to comfort measures in house and what that would entail inclusive of medications to control pain, dyspnea, agitation, nausea, itching, and hiccups. We discussed stopping all unnecessary measures such as blood draws, needle sticks, oxygen, antibiotics, CBGs/insulin, cardiac monitoring, and frequent vital signs. [name] and Laona were both agreeable to move to full comfort today. Eddie told [name] that the rest of the family he had spoken with also supports her in this  decision for full comfort care. I reviewed [facility] current visitation policy for EOL - they both expressed appreciation and understanding.  I offered and discussed chaplain services. [name] tells me that Ms. Camuso is of [employer]. She stated that her hope was that Ms. Eckhart's priest would be able to visit her to perform anointing. I encouraged and supported this visit. I encouraged [name] to let the priest know the visit is recommended sooner than later. Prognostication was discussed - I would not be surprised if the patient passed within the next 24 hours but could be 24-48 hours. I offered to have chaplain stop by - [name] stated that prayer would be appreciated.   I explained to [name] that I did not feel the patient was stable enough for transfer to residential hospice facility - as the risk of her passing during transport is high. We discussed keeping Ms. Fratus in house for EOL care and [facility] was agreeable to keep the patient here and does not want transfer.  Visit also consisted of discussions dealing with the complex and emotionally intense issues of symptom management and palliative care in the setting of serious and life-threatening illness. Palliative care team will continue to support patient, patient's family, and medical team.  Discussed with [name] the importance of continued conversation with family and the medical providers regarding overall plan of care and treatment options, ensuring decisions are within the context of the patient's values and GOCs.    Questions and concerns were addressed. The patient/family was encouraged to call with questions and/or concerns. PMT card was provided.  Provided verbal updates to RN about new goals for care to include comfort. Requested all cardiac monitoring, continuous pulse ox, and any other unnecessary lines/tubes be removed. Requested RN administer dose of fentanyl and ativan once orders were in.    Primary Decision  Maker: NEXT OF KIN - Barbara Harris/daughter - she is in contact with other siblings who defer to her as the primary decision maker    SUMMARY OF RECOMMENDATIONS: Initiated full comfort measures Continue DNR/DNI as previously documented Patient not stable for transfer - will be hospital death - likely within or around the next 24-48 hours Added orders for EOL symptom management and to reflect full comfort measures, as well as discontinued orders that were not focused on comfort Unrestricted visitation orders were placed per current [facility] EOL visitation policy  Wean patient to room air per "oxygen therapy" order Chaplain consulted for: family request for prayer for patient at EOL Provide frequent assessments and administer PRN medications as clinically necessary to ensure EOL comfort PMT will continue to follow holistically  Code Status/Advance Care Planning:  DNR  Palliative Prophylaxis:   Aspiration, Bowel Regimen, Delirium Protocol, Frequent Pain Assessment, Oral Care and Turn Reposition  Additional Recommendations (Limitations, Scope, Preferences):  Full Comfort Care  Psycho-social/Spiritual:   Desire for further Chaplaincy support:yes Created space and opportunity for patient and family to express thoughts and feelings regarding patient's current medical situation.   Emotional support and therapeutic listening provided.  Prognosis:   Hours - Days  Discharge Planning: Anticipated Hospital Death      Primary Diagnoses: Present on Admission: . Hip fracture ([place]) . Sepsis ([place])   I have reviewed the medical record, interviewed the patient and family, and examined the patient. The following aspects are pertinent.  Past Medical History:  Diagnosis Date  . Allergy   . Arthritis   . Cataract   . Depression   . Osteoporosis    Social History   Socioeconomic History  . Marital status: Married    Spouse name: Not on file  . Number of children: Not  on file  . Years of education: Not on file  . Highest education level: Not on file  Occupational History  . Not on file  Tobacco Use  . Smoking status: Former Research scientist (life sciences)  . Smokeless tobacco: Not on file  Substance and Sexual Activity  . Alcohol use: No  . Drug use: No  . Sexual activity: Not on file  Other Topics Concern  . Not on file  Social History Narrative  . Not on file   Social Determinants of Health   Financial Resource Strain: Not on file  Food Insecurity: Not on file  Transportation Needs: Not on file  Physical Activity: Not on file  Stress: Not on file  Social Connections: Not on file   Family History  Problem Relation Age of Onset  . Stroke Mother   . Stroke Father   .  Heart disease Father    Scheduled Meds: . antiseptic oral rinse  15 mL Topical BID   Continuous Infusions: PRN Meds:.acetaminophen, fentaNYL (SUBLIMAZE) injection, glycopyrrolate **OR** glycopyrrolate **OR** glycopyrrolate, haloperidol **OR** haloperidol lactate, LORazepam, ondansetron **OR** ondansetron (ZOFRAN) IV, polyvinyl alcohol Medications Prior to Admission:  Prior to Admission medications   Medication Sig Start Date End Date Taking? Authorizing Provider  cholecalciferol (VITAMIN D) 400 units TABS tablet Take 400 Units by mouth daily.   Yes [provider]  famotidine (PEPCID) 20 MG tablet Take 20 mg by mouth daily as needed for heartburn or indigestion.   Yes [provider]  FLUoxetine (PROZAC) 10 MG tablet Take 10 mg by mouth every morning. 04/01/20  Yes [provider]  Multiple Vitamin (MULTIVITAMIN) tablet Take 1 tablet by mouth daily.   Yes [provider]   No Known Allergies Review of Systems  Unable to perform ROS: Acuity of condition    Physical Exam Vitals and nursing note reviewed.  Constitutional:      General: She is not in acute distress.    Appearance: She is cachectic. She is ill-appearing.     Comments: Appears generally  uncomfortable and restless  Pulmonary:     Effort: No respiratory distress.     Comments: Increased work of breathing Skin:    General: Skin is warm and dry.  Neurological:     Mental Status: She is lethargic.     Motor: Weakness present.  Psychiatric:        Speech: She is noncommunicative.     Vital Signs: BP (!) 68/38   Pulse 62   Temp 99.2 F (37.3 C) (Axillary)   Resp 18   SpO2 (!) 76%  Pain Scale: PAINAD   Pain Score: 0-No pain   SpO2: SpO2: (!) 76 % O2 Device:SpO2: (!) 76 % O2 Flow Rate: .   IO: Intake/output summary:   Intake/Output Summary (Last 24 hours) at 05/13/2020 2253 Last data filed at 04/23/2020 1748 Gross per 24 hour  Intake 2701.49 ml  Output 200 ml  Net 2501.49 ml    LBM:   Baseline Weight:   Most recent weight:       Palliative Assessment/Data: PPS 10%     Time In: 0930 Time Out: 1050 Time Total: 80 minutes  Greater than 50%  of this time was spent counseling and coordinating care related to the above assessment and plan.  Signed by: Lin Landsman, NP   Please contact Palliative Medicine Team phone at [phone] for questions and concerns.  For individual provider: See Shea Evans

## 2020-05-14 NOTE — ED Notes (Signed)
Family requested RN to take all cords off pt.

## 2020-05-14 NOTE — ED Notes (Signed)
Palliative paged. Pt will be seen this AM.

## 2020-05-14 NOTE — Consult Note (Cosign Needed Addendum)
Consultation Note Date: 05/01/2020   Patient Name: Kimberly Mcconnell  DOB: June 26, 1924  MRN: [id number]  Age / Sex: 87 y.o., female  PCP: Kimberly Bien, MD Referring Physician: Antonieta Pert, MD  Reason for Consultation: Establishing goals of care  HPI/Patient Profile: 87 y.o. female  with past medical history of osteoporosis, advanced dementia, and SVT presented to the ED on 05/09/2020 from [facility] independent living after caregiver found her on the floor of her home covered in feces - unsure how long down. In ED patient was found to have a hip fracture but main concern was for sepsis. Incidental left groin hematoma and questionable SFA injury/disection was noted per CT. Patient was admitted on 04/18/2020 with sepsis, question of left SFA dissection, left hip fracture, AKI, and acute blood loss anemia.  ED Course: X-ray showed left hip fracture.  Patient was found to be very tachycardia, lactic acid 7.6, creatinine 1.49 compared to baseline around 1.0.  WBC 20.  No clear source of infection was identified, CT abdomen pelvis with contrast showed large left groin hematoma and questionable SFA dissection/injury.  Of note, Kimberly Mcconnell has been followed by [facility] outpatient Palliative Care.  Clinical Assessment and Goals of Care: I have reviewed medical records including EPIC notes, labs, and imaging. Received report from primary RN - acute concern that patient is critically ill. RN states patient was comfortable on dilaudid; however, patient became hypotensive and subsequently was given a dose of narcan. RN states patient seems very uncomfortable now and remains hypotensive and critically ill.    PMT received several calls from providers requesting PMT prioritize patient due to her critical state and concern for high risk decompensation.   Received updates from Dr. Lupita Mcconnell via phone.  Went to visit patient at bedside  - daughter/Kimberly was present. Patient was lying in bed awake and alert - she was not able to participate in conversation verbally but seemed to understand conversation around her as she would nod "yes" and "no" to questions. Non-verbal gestures of pain and discomfort were noted along with increased work of breathing. Patient overall looked very uncomfortable and restless. No respiratory distress or secretions were noted. She was on non-rebreather during visit and remained hypotensive on monitor.   Met with daughter/Kimberly  to discuss diagnosis, prognosis, GOC, EOL wishes, disposition, and options.  I introduced Palliative Medicine as specialized medical care for people living with serious illness. It focuses on providing relief from the symptoms and stress of a serious illness. The goal is to improve quality of life for both the patient and the family.  We discussed a brief life review of the patient as well as functional and nutritional status. Prior to this hospitalization, Kimberly Mcconnell was living at an independent living facility. She was able to ambulate independently but did require supportive assistance from caregivers several hours each day for ADLs. Kimberly Mcconnell states the patient was also eating and drinking well prior to this incident.   We discussed patient's current illness and what it means in the  larger context of patient's on-going co-morbidities. Kimberly Mcconnell had a clear understanding of the patient's current acute medical situation but states she was still waiting for updates from medical team. I was able to provide Kimberly Mcconnell with updates/recommendations from medical team as outlined per their notes and my discussions with them. Natural disease trajectory and expectations at EOL were discussed. I attempted to elicit values and goals of care important to the patient. The difference between aggressive medical intervention and comfort care was considered in light of the patient's goals of care. Kimberly Mcconnell  tells me that she does not want the patient to suffer. She states she has been in contact with her siblings and they were all ok with moving toward full comfort measures; however, tearfully Kimberly Mcconnell stated she "wanted to make sure it was the right decision." I could tell the decision was weighing heavy on her as she seemed overwhelmed and anxious. I discussed with her that there are no "right or wrong" answers, but as long as decisions are made with Kimberly Mcconnell's goals and wishes in mind, that is what matters. I provided emotional support and validation that these are very hard decisions to make. We discussed through conversations she had previously had with Kimberly Mcconnell, which per Kimberly Mcconnell, patient would not want prolonged aggressive interventions. As we discussed this topic, Kimberly Mcconnell shook her head "no" to agree she would not want this. Kimberly Mcconnell asked Kimberly Mcconnell if she was "ready to go" be with her husband and to be "kept comfortable now," in which [name] shook her head "yes." I expressed again that as long as Kimberly Mcconnell made decisions around Kimberly Mcconnell's goals, they were not wrong decisions but loving and kind ones. Kimberly Mcconnell seemed to accept transition to full comfort care at that time. She asked to call her husband/Kimberly Mcconnell - I encouraged her to reach out to whomever she felt needed to join our discussion.   Kimberly Mcconnell called her husband/Kimberly Mcconnell and placed him on speakerphone. I briefly reviewed information with him as outlined above. We talked about transition to comfort measures in house and what that would entail inclusive of medications to control pain, dyspnea, agitation, nausea, itching, and hiccups. We discussed stopping all unnecessary measures such as blood draws, needle sticks, oxygen, antibiotics, CBGs/insulin, cardiac monitoring, and frequent vital signs. Kimberly Mcconnell and Kimberly Mcconnell were both agreeable to move to full comfort today. Kimberly Mcconnell told Kimberly Mcconnell that the rest of the family he had spoken with also supports her in this  decision for full comfort care. I reviewed [facility]'s current visitation policy for EOL - they both expressed appreciation and understanding.  I offered and discussed chaplain services. Kimberly Mcconnell tells me that Ms. Brazil is of [employer]. She stated that her hope was that Ms. Lusk's priest would be able to visit her to perform anointing. I encouraged and supported this visit. I encouraged Kimberly Mcconnell to let the priest know the visit is recommended sooner than later. Prognostication was discussed - I would not be surprised if the patient passed within the next 24 hours but could be 24-48 hours. I offered to have chaplain stop by - Kimberly Mcconnell stated that prayer would be appreciated.   I explained to Kimberly Mcconnell that I did not feel the patient was stable enough for transfer to residential hospice facility - as the risk of her passing during transport is high. We discussed keeping Ms. Selvage in house for EOL care and [place] was agreeable to keep the patient here and does not want transfer.  Visit also consisted of discussions dealing with the complex and emotionally intense issues of symptom management and palliative care in the setting of serious and life-threatening illness. Palliative care team will continue to support patient, patient's family, and medical team.  Discussed with Kimberly Mcconnell the importance of continued conversation with family and the medical providers regarding overall plan of care and treatment options, ensuring decisions are within the context of the patient's values and GOCs.    Questions and concerns were addressed. The patient/family was encouraged to call with questions and/or concerns. PMT card was provided.  Provided verbal updates to RN about new goals for care to include comfort. Requested all cardiac monitoring, continuous pulse ox, and any other unnecessary lines/tubes be removed. Requested RN administer dose of fentanyl and ativan once orders were in.    Primary Decision  Maker: NEXT OF KIN - Kimberly Mcconnell/daughter - she is in contact with other siblings who defer to her as the primary decision maker    SUMMARY OF RECOMMENDATIONS: Initiated full comfort measures Continue DNR/DNI as previously documented Patient not stable for transfer - will be hospital death - would not be surprised if patient passed within the next 24 hours but could be 24-48 hours Kimberly Mcconnell requests to be informed of the source of infection, once/if it becomes known Added orders for EOL symptom management and to reflect full comfort measures, as well as discontinued orders that were not focused on comfort Unrestricted visitation orders were placed per current [facility] EOL visitation policy  Wean patient to room air  Chaplain consulted for: family request prayer for patient at EOL Provide frequent assessments and administer PRN medications as clinically necessary to ensure EOL comfort PMT will continue to follow holistically  Code Status/Advance Care Planning:  DNR  Palliative Prophylaxis:   Aspiration, Bowel Regimen, Delirium Protocol, Frequent Pain Assessment, Oral Care and Turn Reposition  Additional Recommendations (Limitations, Scope, Preferences):  Full Comfort Care  Psycho-social/Spiritual:   Desire for further Chaplaincy support:yes Created space and opportunity for patient and family to express thoughts and feelings regarding patient's current medical situation.   Emotional support and therapeutic listening provided.  Prognosis:   Hours - Days  Discharge Planning: Anticipated Hospital Death      Primary Diagnoses: Present on Admission: . Hip fracture ([place]) . Sepsis ([place])   I have reviewed the medical record, interviewed the patient and family, and examined the patient. The following aspects are pertinent.  Past Medical History:  Diagnosis Date  . Allergy   . Arthritis   . Cataract   . Depression   . Osteoporosis    Social History    Socioeconomic History  . Marital status: Married    Spouse name: Not on file  . Number of children: Not on file  . Years of education: Not on file  . Highest education level: Not on file  Occupational History  . Not on file  Tobacco Use  . Smoking status: Former Research scientist (life sciences)  . Smokeless tobacco: Not on file  Substance and Sexual Activity  . Alcohol use: No  . Drug use: No  . Sexual activity: Not on file  Other Topics Concern  . Not on file  Social History Narrative  . Not on file   Social Determinants of Health   Financial Resource Strain: Not on file  Food Insecurity: Not on file  Transportation Needs: Not on file  Physical Activity: Not on file  Stress: Not on file  Social Connections: Not on file  Family History  Problem Relation Age of Onset  . Stroke Mother   . Stroke Father   . Heart disease Father    Scheduled Meds: . antiseptic oral rinse  15 mL Topical BID   Continuous Infusions: PRN Meds:.acetaminophen, fentaNYL (SUBLIMAZE) injection, glycopyrrolate **OR** glycopyrrolate **OR** glycopyrrolate, haloperidol **OR** haloperidol lactate, LORazepam, ondansetron **OR** ondansetron (ZOFRAN) IV, polyvinyl alcohol Medications Prior to Admission:  Prior to Admission medications   Medication Sig Start Date End Date Taking? Authorizing Provider  cholecalciferol (VITAMIN D) 400 units TABS tablet Take 400 Units by mouth daily.   Yes [provider]  famotidine (PEPCID) 20 MG tablet Take 20 mg by mouth daily as needed for heartburn or indigestion.   Yes [provider]  FLUoxetine (PROZAC) 10 MG tablet Take 10 mg by mouth every morning. 04/01/20  Yes [provider]  Multiple Vitamin (MULTIVITAMIN) tablet Take 1 tablet by mouth daily.   Yes [provider]   No Known Allergies Review of Systems  Unable to perform ROS: Acuity of condition    Physical Exam Vitals and nursing note reviewed.  Constitutional:      General: She is not in  acute distress.    Appearance: She is cachectic. She is ill-appearing.     Comments: Appears generally uncomfortable and restless  Pulmonary:     Effort: No respiratory distress.     Comments: Increased work of breathing Skin:    General: Skin is warm and dry.  Neurological:     Mental Status: She is lethargic.     Motor: Weakness present.  Psychiatric:        Speech: She is noncommunicative.     Vital Signs: BP (!) 68/38   Pulse 62   Temp 99.2 F (37.3 C) (Axillary)   Resp 18   SpO2 (!) 76%  Pain Scale: PAINAD   Pain Score: 0-No pain   SpO2: SpO2: (!) 76 % O2 Device:SpO2: (!) 76 % O2 Flow Rate: .   IO: Intake/output summary:   Intake/Output Summary (Last 24 hours) at 05/12/2020 2253 Last data filed at 05/12/2020 1748 Gross per 24 hour  Intake 2701.49 ml  Output 200 ml  Net 2501.49 ml    LBM:   Baseline Weight:   Most recent weight:       Palliative Assessment/Data: PPS 10%     Time In: 0930 Time Out: 1050 Time Total: 80 minutes  Greater than 50%  of this time was spent counseling and coordinating care related to the above assessment and plan.  Signed by: Lin Landsman, NP   Please contact Palliative Medicine Team phone at [phone] for questions and concerns.  For individual provider: See Shea Evans

## 2020-05-14 NOTE — ED Notes (Signed)
RN checked manual B/P. RN found pt B/P to be 78/42. MD Dayna Barker made aware. 500 ml B/P ordered. Pt hypoxic on RA. PT 02 sat in 70's. NRB placed, 02 sat now 95. RN to titrate down as needed.

## 2020-05-15 DIAGNOSIS — S72002A Fracture of unspecified part of neck of left femur, initial encounter for closed fracture: Secondary | ICD-10-CM | POA: Diagnosis not present

## 2020-05-15 LAB — URINE CULTURE

## 2020-05-15 MED ORDER — FENTANYL BOLUS VIA INFUSION
25.0000 ug | INTRAVENOUS | Status: DC | PRN
  Administered 2020-05-15: 50 ug via INTRAVENOUS
  Administered 2020-05-16: 25 ug via INTRAVENOUS
  Administered 2020-05-16: 50 ug via INTRAVENOUS
  Filled 2020-05-15: qty 50

## 2020-05-15 MED ORDER — FENTANYL 2500MCG IN NS 250ML (10MCG/ML) PREMIX INFUSION
0.0000 ug/h | INTRAVENOUS | Status: DC
  Administered 2020-05-15: 25 ug/h via INTRAVENOUS
  Administered 2020-05-16: 250 ug/h via INTRAVENOUS
  Filled 2020-05-15 (×3): qty 250

## 2020-05-15 MED ORDER — SCOPOLAMINE 1 MG/3DAYS TD PT72
1.0000 | MEDICATED_PATCH | TRANSDERMAL | Status: DC
  Administered 2020-05-15: 1.5 mg via TRANSDERMAL
  Filled 2020-05-15: qty 1

## 2020-05-15 MED ORDER — LORAZEPAM 2 MG/ML IJ SOLN
1.0000 mg | Freq: Three times a day (TID) | INTRAMUSCULAR | Status: DC
  Administered 2020-05-15 – 2020-05-16 (×3): 1 mg via INTRAVENOUS
  Filled 2020-05-15 (×2): qty 1

## 2020-05-15 MED ORDER — WHITE PETROLATUM EX OINT
TOPICAL_OINTMENT | CUTANEOUS | Status: AC
  Filled 2020-05-15: qty 28.35

## 2020-05-15 NOTE — Progress Notes (Signed)
Palliative Medicine RN Note: Symptom check. Patient is having labored breathing on my arrival that did not improve with a bolus of fentanyl and increase in basal rate. Daughter reports she responded well to Ativan this am; obtained rx to scheduled Ativan q8 and still have q1prn dose.  Long visit with her daughter to ensure that all questions were answered. Of note, pt has an elevated SCr, so morphine would not be our first choice for opioid; if rotation is required, we recommend Dilaudid.  Discussed with family that pts do sometimes wait for family to leave or other family to visit; daughter has given her permission to let go.  PMT will follow up tomorrow to ensure continued comfort.  Margret Chance Ronny Ruddell, RN, BSN, [facility] Palliative Medicine Team 05/15/2020 4:36 PM Office [phone]

## 2020-05-15 NOTE — Progress Notes (Addendum)
PROGRESS NOTE    Kimberly PeersMarie L Mcconnell  ZOX:[id number] DOB: 19-Feb-1925 DOA: 05/10/2020 PCP: Lewis Moccasinewey, Elizabeth R, MD   Chief Complaint  Patient presents with  . Fall  . Weakness    Brief Narrative: 87 year old female with history of paroxysmal SVT advanced dementia osteoporosis poor historian at baseline presented to ED 04/20/2020 after an witnessed fall during the night on 1/25.   As per the report- she was last seen normal 1/25 afternoon by home care service.  Patient then was found naked on the bathroom floor, confused.  Likely patient had a unwitnessed fall sometime 1/25 night.  But due to dementia and confusion patient cannot provide further information. In ED X-ray showed left hip fracture.  Patient was found to be very tachycardia, lactic acid 7.6, creatinine 1.49 compared to baseline around 1.0.  WBC 20>24K. No clear source of infection was identified, CT abdomen pelvis with contrast showed large left groin hematoma and questionable SFA dissection/injury Vascular and orthopedic were consulted. Patient suspected to have a severe sepsis 1/27-in the ED patient was hypoxic needing non-brebreather, hypotensive in 70s IV fluid bolus was initiated sepsis bundle with lactic acid was ordered on admission and palliative care also urgently saw the patient and after extended discussion-patient's family opted for comfort measures.  Subjective:  Patient resting comfortably, minimally responsive. Daughter at the bedside.  At times having gurgling sound Remains on fently drip from overnight and seems to be more calm with it.   Assessment & Plan:  Goals of care/COMFORT MEASURE: Patient is DNR/DNI.  Seen by palliative care had extensive family meeting daughter POA-patient presented with unwitnessed fall found to have hip fracture sepsis unknown source and was critically ill hypoxic hypotensive- PO/family elected for comfort measures, cont per palliative care.Continue comfort measures protocol for pain  management, secretion managements.    SIRS w/ hypotension and lactic acidosis: Unclear source, with significantly elevated white count lactic acid and hypotension, blood culture were sent and patient was initially on empiric vancomycin and cefepime.No obvious pneumonia on chest x-ray, UA large le, neg nitrite WBC 0-5 not impressive enough for UTI.  Procalcitonin at 0.4, COVID-19 negative, WBC was worsening 24k and patient hemodynamically unstable.  Palliative had meeting and patient was changed to full comfort Acute hypoxic respiratory failure. Left hip fracture from unwitnessed fall at home.   AKI on CKD stage IIIa: Creatinine  1.4>1.1 Mild rhabdomyolysis CK IN 600- from ying on the floor nontraumatic Diarrhea at home, unknown period large left groin hematoma with suspected left SFA Dissection in CTA- US art suspected left SFA dissection versus plaque.Seen by Dr. Myra GianottiBrabham ABIs could not be performed secondary to calcification toe pressures were absent.Vascular surgery signed off. Anemia likely anemia of chronic disease and acute blood loss anemia form hip fracture: acute drop Hb 9.3> 7.6 gm  PSVT Acute metabolic encephalopathy in the setting of advanced dementia at baseline very poor short-term memory oriented x1-2   Nutrition: Diet Order            Diet regular Room service appropriate? Yes; Fluid consistency: Thin  Diet effective now                 There is no height or weight on file to calculate BMI.  DVT prophylaxis:  Code Status:   Code Status: DNR  Family Communication: plan of care discussed with patient's daughter at bedside.  Status is: Inpatient Remains inpatient appropriate because:Inpatient level of care appropriate due to severity of illness and Ongoing management of severe  sepsis hip fracture AKI, anemia  Dispo: The patient is from: Home              Anticipated d/c is to: tbd              Anticipated d/c date is: 1-2 days              Patient currently is not  medically stable to d/c.   Difficult to place patient No  Consultants:see note  Procedures:see note  Culture/Microbiology    Component Value Date/Time   SDES BLOOD LEFT HAND 05/09/2020 0015   SPECREQUEST  05/16/2020 0015    BOTTLES DRAWN AEROBIC AND ANAEROBIC Blood Culture adequate volume   CULT  04/18/2020 0015    NO GROWTH 1 DAY Performed at [facility], [address] [place], [place] [address]    REPTSTATUS PENDING 05/07/2020 0015    Other culture-see note  Medications: Scheduled Meds: . antiseptic oral rinse  15 mL Topical BID   Continuous Infusions: . fentaNYL infusion INTRAVENOUS 25 mcg/hr (05/15/20 0225)    Antimicrobials: Anti-infectives (From admission, onward)   Start     Dose/Rate Route Frequency Ordered Stop   05/15/20 1400  vancomycin (VANCOREADY) IVPB 750 mg/150 mL  Status:  Discontinued        750 mg 150 mL/hr over 60 Minutes Intravenous Every 48 hours 05/04/2020 1416 05/16/2020 1050   05/07/2020 1400  ceFEPIme (MAXIPIME) 2 g in sodium chloride 0.9 % 100 mL IVPB  Status:  Discontinued        2 g 200 mL/hr over 30 Minutes Intravenous Every 24 hours 05/16/2020 1416 05/06/2020 1050   05/09/2020 0600  ceFAZolin (ANCEF) IVPB 2g/100 mL premix  Status:  Discontinued        2 g 200 mL/hr over 30 Minutes Intravenous On call to O.R. 04/30/2020 2122 05/09/2020 1050   05/10/2020 1400  ceFEPIme (MAXIPIME) 2 g in sodium chloride 0.9 % 100 mL IVPB        2 g 200 mL/hr over 30 Minutes Intravenous  Once 04/18/2020 1356 05/16/2020 1612   05/16/2020 1400  metroNIDAZOLE (FLAGYL) IVPB 500 mg        500 mg 100 mL/hr over 60 Minutes Intravenous  Once 04/30/2020 1356 04/23/2020 1806   05/09/2020 1400  vancomycin (VANCOCIN) IVPB 1000 mg/200 mL premix        1,000 mg 200 mL/hr over 60 Minutes Intravenous  Once 05/16/2020 1356 05/07/2020 2120     Objective: Vitals: Today's Vitals   05/06/2020 1430 04/18/2020 1445 04/28/2020 1500 05/15/20 0034  BP:      Pulse: 62     Resp: (!) 21 17 18    Temp:       TempSrc:      SpO2: (!) 76%     PainSc:    Asleep    Intake/Output Summary (Last 24 hours) at 05/15/2020 1041 Last data filed at 04/19/2020 1748 Gross per 24 hour  Intake 0 ml  Output 0 ml  Net 0 ml   There were no vitals filed for this visit. Weight change:   Intake/Output from previous day: 01/27 0701 - 01/28 0700 In: 2701.5 [I.V.:2201.5; IV Piggyback:500] Out: 0  Intake/Output this shift: No intake/output data recorded. There were no vitals filed for this visit.  Examination:  General exam: Comfortable, minimally responsive HEENT:Oral mucosa dry, Ear/Nose WNL grossly, dentition normal. Respiratory system: Breathing rapidly this morning  Cardiovascular system: S1 & S2 +, No JVD,. Gastrointestinal system: Abdomen soft, NT,ND,  BS+ Nervous System: Minimally responsive, spontaneously breathing  Extremities: warm periphery Skin: No rashes,no icterus.   Data Reviewed: I have personally reviewed following labs and imaging studies CBC: Recent Labs  Lab 04/21/2020 1301 05-22-20 0015  WBC 20.4* 24.2*  NEUTROABS 16.5*  --   HGB 9.3* 7.6*  HCT 26.7* 21.6*  MCV 86.4 85.4  PLT 177 211   Basic Metabolic Panel: Recent Labs  Lab 04/20/2020 1301 May 22, 2020 0015  NA 137 136  K 4.7 4.5  CL 105 106  CO2 17* 19*  GLUCOSE 182* 153*  BUN 20 22  CREATININE 1.49* 1.19*  CALCIUM 8.4* 8.4*   GFR: CrCl cannot be calculated (Unknown ideal weight.). Liver Function Tests: Recent Labs  Lab 05/03/2020 1301  AST 49*  ALT 20  ALKPHOS 41  BILITOT 1.2  PROT 4.9*  ALBUMIN 3.2*   Recent Labs  Lab 05/16/2020 1301  LIPASE 24   No results for input(s): AMMONIA in the last 168 hours. Coagulation Profile: No results for input(s): INR, PROTIME in the last 168 hours. Cardiac Enzymes: Recent Labs  Lab 04/18/2020 1301  CKTOTAL 664*   BNP (last 3 results) No results for input(s): PROBNP in the last 8760 hours. HbA1C: No results for input(s): HGBA1C in the last 72 hours. CBG: No  results for input(s): GLUCAP in the last 168 hours. Lipid Profile: No results for input(s): CHOL, HDL, LDLCALC, TRIG, CHOLHDL, LDLDIRECT in the last 72 hours. Thyroid Function Tests: No results for input(s): TSH, T4TOTAL, FREET4, T3FREE, THYROIDAB in the last 72 hours. Anemia Panel: No results for input(s): VITAMINB12, FOLATE, FERRITIN, TIBC, IRON, RETICCTPCT in the last 72 hours. Sepsis Labs: Recent Labs  Lab 04/28/2020 1253 04/23/2020 1903 May 22, 2020 0015  PROCALCITON  --   --  0.44  LATICACIDVEN 7.6* 4.6*  --     Recent Results (from the past 240 hour(s))  Urine culture     Status: Abnormal   Collection Time: 05/11/2020 12:08 AM   Specimen: In/Out Cath Urine  Result Value Ref Range Status   Specimen Description IN/OUT CATH URINE  Final   Special Requests   Final    NONE Performed at [facility], [address], [place], [place] [address]    Culture MULTIPLE SPECIES PRESENT, SUGGEST RECOLLECTION (A)  Final   Report Status 05/15/2020 FINAL  Final  Blood Culture (routine x 2)     Status: None (Preliminary result)   Collection Time: 05/15/2020  3:25 PM   Specimen: BLOOD  Result Value Ref Range Status   Specimen Description BLOOD RIGHT ANTECUBITAL  Final   Special Requests   Final    BOTTLES DRAWN AEROBIC AND ANAEROBIC Blood Culture adequate volume   Culture   Final    NO GROWTH 2 DAYS Performed at [facility], [address], [place], [place] [address]    Report Status PENDING  Incomplete  SARS Coronavirus 2 by RT PCR (hospital order, performed in [facility] hospital lab) Nasopharyngeal Nasopharyngeal Swab     Status: None   Collection Time: 05/09/2020  4:15 PM   Specimen: Nasopharyngeal Swab  Result Value Ref Range Status   SARS Coronavirus 2 NEGATIVE NEGATIVE Final    Comment: (NOTE) SARS-CoV-2 target nucleic acids are NOT DETECTED.  The SARS-CoV-2 RNA is generally detectable in upper and lower respiratory specimens during the acute phase of infection. The  lowest concentration of SARS-CoV-2 viral copies this assay can detect is 250 copies / mL. A negative result does not preclude  SARS-CoV-2 infection and should not be used as the sole basis for treatment or other patient management decisions.  A negative result may occur with improper specimen collection / handling, submission of specimen other than nasopharyngeal swab, presence of viral mutation(s) within the areas targeted by this assay, and inadequate number of viral copies (<250 copies / mL). A negative result must be combined with clinical observations, patient history, and epidemiological information.  Fact Sheet for Patients:   BoilerBrush.com.cy  Fact Sheet for Healthcare Providers: https://pope.com/  This test is not yet approved or  cleared by the [place] FDA and has been authorized for detection and/or diagnosis of SARS-CoV-2 by FDA under an Emergency Use Authorization (EUA).  This EUA will remain in effect (meaning this test can be used) for the duration of the COVID-19 declaration under Section 564(b)(1) of the Act, 21 U.S.C. section 360bbb-3(b)(1), unless the authorization is terminated or revoked sooner.  Performed at [facility], [address] [place], [place] [address]   Blood Culture (routine x 2)     Status: None (Preliminary result)   Collection Time: May 26, 2020 12:15 AM   Specimen: BLOOD LEFT HAND  Result Value Ref Range Status   Specimen Description BLOOD LEFT HAND  Final   Special Requests   Final    BOTTLES DRAWN AEROBIC AND ANAEROBIC Blood Culture adequate volume   Culture   Final    NO GROWTH 1 DAY Performed at [facility], [address] [place], [place] [address]    Report Status PENDING  Incomplete     Radiology Studies: DG Chest 2 View  Result Date: 04/28/2020 CLINICAL DATA:  87 year old female status post fall.  Left hip pain. EXAM: CHEST - 2 VIEW COMPARISON:  Chest  radiographs 10/17/2017 and earlier. FINDINGS: Semi upright AP and lateral views of the chest. Moderate to large chronic hiatal hernia with an air-fluid level today. Borderline cardiomegaly. Other mediastinal contours are within normal limits. Calcified aortic atherosclerosis. Large lung volumes. No pneumothorax, pulmonary edema, pleural effusion or acute pulmonary opacity. Osteopenia.  No acute osseous abnormality identified. IMPRESSION: 1. No acute cardiopulmonary abnormality. 2. Chronic pulmonary hyperinflation. Moderate to large chronic hiatal hernia. Aortic Atherosclerosis (ICD10-I70.0). Electronically Signed   By: Odessa Fleming M.D.   On: 05/18/2020 11:54   CT Head Wo Contrast  Result Date: 04/21/2020 CLINICAL DATA:  Neck trauma, fall with accident in the bathroom. 87 year old female. EXAM: CT HEAD WITHOUT CONTRAST CT CERVICAL SPINE WITHOUT CONTRAST TECHNIQUE: Multidetector CT imaging of the head and cervical spine was performed following the standard protocol without intravenous contrast. Multiplanar CT image reconstructions of the cervical spine were also generated. COMPARISON:  MRI of the brain from October of 2012 FINDINGS: CT HEAD FINDINGS Brain: No evidence of acute infarction, hemorrhage, hydrocephalus, extra-axial collection or mass lesion/mass effect. Marked atrophy and chronic microvascular ischemic changes. Findings are similar to exam from 2012. Vascular: No hyperdense vessel or unexpected calcification. Skull: Normal. Negative for fracture or focal lesion. Sinuses/Orbits: Mild LEFT maxillary and ethmoid sinus disease. Orbits without acute finding. Other: None CT CERVICAL SPINE FINDINGS Alignment: Assessment limited by gross patient motion. Particularly at the C2 level. Repeat attempted. Exaggerated lordotic curvature of the cervical spine. Likely positional. Skull base and vertebrae: Atlantooccipital joints with normal relationships. Lantus dens interval grossly normal. Motion related artifact is  significant about C2 despite repeat images. Posterior elements with normal relationships at this level no visible fracture on motion compromised images. Soft tissues and spinal canal: No  prevertebral fluid or swelling. No visible canal hematoma. Disc levels: Multilevel spondylosis greatest C2-3 and C3-4 and C6-7. Upper chest: Negative. Other: None IMPRESSION: 1. No acute intracranial abnormality. 2. Marked atrophy and chronic microvascular ischemic changes of the brain, similar to exam from 2012. 3. Assessment of the cervical spine limited by gross patient motion. Particularly at the C2 level. Normal lesion sips of posterior elements and no visible fracture on coronal reconstructions. Limitations are present despite attempted repeat images. If there is high clinical suspicion for fracture images could be repeated though may yield similar results. 4. Multilevel spondylosis. Electronically Signed   By: Donzetta Kohut M.D.   On: 05/12/2020 15:44   CT Cervical Spine Wo Contrast  Result Date: 05/16/2020 CLINICAL DATA:  Neck trauma, fall with accident in the bathroom. 87 year old female. EXAM: CT HEAD WITHOUT CONTRAST CT CERVICAL SPINE WITHOUT CONTRAST TECHNIQUE: Multidetector CT imaging of the head and cervical spine was performed following the standard protocol without intravenous contrast. Multiplanar CT image reconstructions of the cervical spine were also generated. COMPARISON:  MRI of the brain from October of 2012 FINDINGS: CT HEAD FINDINGS Brain: No evidence of acute infarction, hemorrhage, hydrocephalus, extra-axial collection or mass lesion/mass effect. Marked atrophy and chronic microvascular ischemic changes. Findings are similar to exam from 2012. Vascular: No hyperdense vessel or unexpected calcification. Skull: Normal. Negative for fracture or focal lesion. Sinuses/Orbits: Mild LEFT maxillary and ethmoid sinus disease. Orbits without acute finding. Other: None CT CERVICAL SPINE FINDINGS Alignment:  Assessment limited by gross patient motion. Particularly at the C2 level. Repeat attempted. Exaggerated lordotic curvature of the cervical spine. Likely positional. Skull base and vertebrae: Atlantooccipital joints with normal relationships. Lantus dens interval grossly normal. Motion related artifact is significant about C2 despite repeat images. Posterior elements with normal relationships at this level no visible fracture on motion compromised images. Soft tissues and spinal canal: No prevertebral fluid or swelling. No visible canal hematoma. Disc levels: Multilevel spondylosis greatest C2-3 and C3-4 and C6-7. Upper chest: Negative. Other: None IMPRESSION: 1. No acute intracranial abnormality. 2. Marked atrophy and chronic microvascular ischemic changes of the brain, similar to exam from 2012. 3. Assessment of the cervical spine limited by gross patient motion. Particularly at the C2 level. Normal lesion sips of posterior elements and no visible fracture on coronal reconstructions. Limitations are present despite attempted repeat images. If there is high clinical suspicion for fracture images could be repeated though may yield similar results. 4. Multilevel spondylosis. Electronically Signed   By: Donzetta Kohut M.D.   On: 05/12/2020 15:44   CT ABDOMEN PELVIS W CONTRAST  Result Date: 04/28/2020 CLINICAL DATA:  Suspected diverticulitis. Status post fall with known LEFT hip fracture. EXAM: CT ABDOMEN AND PELVIS WITH CONTRAST TECHNIQUE: Multidetector CT imaging of the abdomen and pelvis was performed using the standard protocol following bolus administration of intravenous contrast. CONTRAST:  75mL OMNIPAQUE IOHEXOL 300 MG/ML  SOLN COMPARISON:  October 03, 2003 FINDINGS: Lower chest: Hiatal hernia extending into the chest. No consolidation. No pleural effusion. Hepatobiliary: Normal appearance of hepatic parenchyma. Patent portal vein. No pericholecystic stranding. No biliary duct dilation. Pancreas: Normal,  without mass, inflammation or ductal dilatation. Spleen: Normal spleen. Adrenals/Urinary Tract: Adrenal glands with mild nodularity, otherwise unremarkable no hydronephrosis. Symmetric renal enhancement. Configuration urinary bladder implies pelvic floor dysfunction but is otherwise unremarkable. Stomach/Bowel: Large hiatal hernia. No acute gastrointestinal process. Colonic diverticulosis. Normal appendix. Vascular/Lymphatic: Extensive atheromatous plaque both calcified and noncalcified in the abdominal aorta extending in the iliac and  femoral vasculature. Also extending into visceral branches. No aneurysmal dilation. Irregularity of LEFT superficial femoral artery with attenuation of contrast column and some stranding around the vessel in the setting of extensive LEFT lower extremity stranding following markedly displaced LEFT proximal femoral fracture. There is no gastrohepatic or hepatoduodenal ligament lymphadenopathy. No retroperitoneal or mesenteric lymphadenopathy. No pelvic sidewall lymphadenopathy. Reproductive: Unremarkable CT appearance of uterus and adnexa. Other: No ascites. Musculoskeletal: Marked Verus angulation of a comminuted LEFT intertrochanteric fracture with separate fragments of both greater and lesser trochanter. Marked stranding in the area and edema and hematoma tracking throughout musculature. Superficial femoral artery with irregularity and limited opacification, potentially occluded. Profunda femoral artery, visualized portions on the LEFT is patent. Expansion of musculature in the LEFT proximal thigh compatible with hematoma surrounding the fracture. IMPRESSION: 1. Signs of comminuted and displaced intertrochanteric fracture of the LEFT femur with adjacent LEFT superficial femoral artery showing long segment irregularity, wall thickening and limited opacification suspicious for vascular injury such as posttraumatic dissection though the area is incompletely imaged. Suggest CT  angiography of the LEFT lower extremity for further evaluation. There is vascular disease in this location i.e. atherosclerotic changes but these do not appear to account for the degree of abnormality seen on the current study. 2. Hematoma in the LEFT thigh tracking through muscular planes likely related to fracture. No visible extravasation though upper thigh is incompletely imaged. Attention to this area on subsequent imaging in the setting of suspected vascular injury. 3. Large hiatal hernia extending into the chest. 4. Colonic diverticulosis without evidence of acute diverticulitis. 5. Aortic atherosclerosis. These results were called by telephone at the time of interpretation on 05/07/2020 at 4:08 pm to provider [facility] , who verbally acknowledged these results. Aortic Atherosclerosis (ICD10-I70.0). Electronically Signed   By: Donzetta Kohut M.D.   On: 05/16/2020 16:03   DG Chest Port 1 View  Result Date: May 22, 2020 CLINICAL DATA:  87 year old female with weakness and hypoxia. EXAM: PORTABLE CHEST 1 VIEW COMPARISON:  Chest radiographs 05/05/2020 and earlier. FINDINGS: Portable AP semi upright view at 0814 hours. Moderate size hiatal hernia again noted. Other mediastinal contours are within normal limits. Visualized tracheal air column is within normal limits. Stable lung volumes. Allowing for portable technique the lungs are clear. Visible bowel gas pattern in the upper abdomen within normal limits. Stable visualized osseous structures. IMPRESSION: No acute cardiopulmonary abnormality.  Hiatal hernia. Electronically Signed   By: Odessa Fleming M.D.   On: 2020-05-22 08:28   VAS [place] GROIN PSEUDOANEURYSM  Result Date: 05/22/20  ARTERIAL PSEUDOANEURYSM  Exam: Left groin Indications: Patient complains of Possible dissection of SFA by CTA. History: Hip fracture. Severe dementia. Limitations: hematoma Comparison Study: No prior study Performing Technologist: Sherren Kerns RVS  Examination Guidelines: A  complete evaluation includes B-mode imaging, spectral Doppler, color Doppler, and power Doppler as needed of all accessible portions of each vessel. Bilateral testing is considered an integral part of a complete examination. Limited examinations for reoccurring indications may be performed as noted.  Summary: Area noted in the common femoral artery possibly suggestive of dissection versus plaque. Diagnosing physician: Lemar Livings MD Electronically signed by Lemar Livings MD on May 22, 2020 at 1:58:19 PM.   --------------------------------------------------------------------------------    Final    VAS [place] ABI WITH/WO TBI  Result Date: 05/22/20 LOWER EXTREMITY DOPPLER STUDY Indications: Peripheral artery disease, and Questionable SFA dissection by CTA. Other Factors: Hip fracture. Advanced dementia.  Limitations: Today's exam was limited due to patient positioning and involuntary  patient movement. Comparison Study: No prior study on file Performing Technologist: Sherren Kerns RVS  Examination Guidelines: A complete evaluation includes at minimum, Doppler waveform signals and systolic blood pressure reading at the level of bilateral brachial, anterior tibial, and posterior tibial arteries, when vessel segments are accessible. Bilateral testing is considered an integral part of a complete examination. Photoelectric Plethysmograph (PPG) waveforms and toe systolic pressure readings are included as required and additional duplex testing as needed. Limited examinations for reoccurring indications may be performed as noted.  ABI Findings: +---------+------------------+-----+------------+--------+ Right    Rt Pressure (mmHg)IndexWaveform    Comment  +---------+------------------+-----+------------+--------+ PTA                             not assessed         +---------+------------------+-----+------------+--------+ DP       87                0.64 monophasic            +---------+------------------+-----+------------+--------+ Great Toe6                 0.04                      +---------+------------------+-----+------------+--------+ +---------+------------------+-----+----------+-------+ Left     Lt Pressure (mmHg)IndexWaveform  Comment +---------+------------------+-----+----------+-------+ Brachial 137                                      +---------+------------------+-----+----------+-------+ PTA      254               1.85 monophasic        +---------+------------------+-----+----------+-------+ DP       48                0.35 monophasic        +---------+------------------+-----+----------+-------+ Great Toe0                 0.00                   +---------+------------------+-----+----------+-------+ +-------+-----------+-----------+------------+------------+ ABI/TBIToday's ABIToday's TBIPrevious ABIPrevious TBI +-------+-----------+-----------+------------+------------+ Right  0.64       0.04                                +-------+-----------+-----------+------------+------------+ Left   calcified  absent                              +-------+-----------+-----------+------------+------------+  Summary: Right: Resting right ankle-brachial index indicates moderate right lower extremity arterial disease. The right toe-brachial index is abnormal. Left: Resting left ankle-brachial index indicates noncompressible left lower extremity arteries. The left toe-brachial index is abnormal.  *See table(s) above for measurements and observations.  Electronically signed by Lemar Livings MD on 04/27/2020 at 1:57:55 PM.    Final    DG HIP UNILAT WITH PELVIS 2-3 VIEWS LEFT  Result Date: 2020-06-07 CLINICAL DATA:  LEFT hip externally rotated unable to internally rotate. Status post fall in this 87 year old female EXAM: DG HIP (WITH OR WITHOUT PELVIS) 2-3V RIGHT; DG HIP (WITH OR WITHOUT PELVIS) 2-3V LEFT COMPARISON:  No recent  comparisons. Prior CT of the abdomen and pelvis from 2005. FINDINGS: AP view of the pelvis without signs of fracture of the  bony pelvis. Assessment mildly limited by stool and gas overlying the sacrum and iliac wings bilaterally. RIGHT hip: Osteopenia. No fracture about the RIGHT hip. Soft tissues are unremarkable. LEFT hip: Comminuted intratrochanteric fracture of the LEFT proximal femur with varus angulation. Standard lateral view not obtainable due to severity of the fracture. Oblique view provided. No cross-table lateral. IMPRESSION: 1. Comminuted intratrochanteric fracture of the LEFT hip with moderate to marked varus angulation. 2. No fracture about the RIGHT hip or bony pelvis. 3. Osteopenia. Electronically Signed   By: Donzetta Kohut M.D.   On: 04/30/2020 12:03   DG HIP UNILAT WITH PELVIS 2-3 VIEWS RIGHT  Result Date: 04/19/2020 CLINICAL DATA:  LEFT hip externally rotated unable to internally rotate. Status post fall in this 87 year old female EXAM: DG HIP (WITH OR WITHOUT PELVIS) 2-3V RIGHT; DG HIP (WITH OR WITHOUT PELVIS) 2-3V LEFT COMPARISON:  No recent comparisons. Prior CT of the abdomen and pelvis from 2005. FINDINGS: AP view of the pelvis without signs of fracture of the bony pelvis. Assessment mildly limited by stool and gas overlying the sacrum and iliac wings bilaterally. RIGHT hip: Osteopenia. No fracture about the RIGHT hip. Soft tissues are unremarkable. LEFT hip: Comminuted intratrochanteric fracture of the LEFT proximal femur with varus angulation. Standard lateral view not obtainable due to severity of the fracture. Oblique view provided. No cross-table lateral. IMPRESSION: 1. Comminuted intratrochanteric fracture of the LEFT hip with moderate to marked varus angulation. 2. No fracture about the RIGHT hip or bony pelvis. 3. Osteopenia. Electronically Signed   By: Donzetta Kohut M.D.   On: 05/08/2020 12:03     LOS: 2 days   Lanae Boast, MD [facility]  05/15/2020, 10:41 AM

## 2020-05-15 NOTE — Progress Notes (Signed)
Patient's daughter expressed that she would like for patient's bladder scan to be done later in the morning.   Daziah Hesler,RN.

## 2020-05-15 NOTE — Progress Notes (Signed)

## 2020-05-15 NOTE — Plan of Care (Signed)
  Problem: Coping: Goal: Ability to identify and develop effective coping behavior will improve Outcome: Progressing   

## 2020-05-16 DIAGNOSIS — S72002A Fracture of unspecified part of neck of left femur, initial encounter for closed fracture: Secondary | ICD-10-CM | POA: Diagnosis not present

## 2020-05-16 DIAGNOSIS — Z7189 Other specified counseling: Secondary | ICD-10-CM | POA: Diagnosis not present

## 2020-05-16 DIAGNOSIS — Z515 Encounter for palliative care: Secondary | ICD-10-CM

## 2020-05-16 DIAGNOSIS — Z66 Do not resuscitate: Secondary | ICD-10-CM | POA: Diagnosis not present

## 2020-05-16 MED ORDER — LORAZEPAM 2 MG/ML IJ SOLN
1.0000 mg | INTRAMUSCULAR | Status: DC
  Administered 2020-05-16 – 2020-05-17 (×3): 1 mg via INTRAVENOUS
  Filled 2020-05-16 (×4): qty 1

## 2020-05-16 NOTE — Progress Notes (Addendum)
Palliative Medicine Inpatient Follow Up Note  HPI: 87 y.o. female  with past medical history of osteoporosis, advanced dementia, and SVT presented to the ED on 05/08/2020 from [facility] independent living after caregiver found her on the floor of her home covered in feces - unsure how long down. In ED patient was found to have a hip fracture but main concern was for sepsis. Incidental left groin hematoma and questionable SFA injury/disection was noted per CT. Patient was admitted on 04/26/2020 with sepsis, question of left SFA dissection, left hip fracture, AKI, and acute blood loss anemia.  Today's Discussion (05/16/2020):  *Please note that this is a verbal dictation therefore any spelling or grammatical errors are due to the "Dragon Medical One" system interpretation.  Chart reviewed.   Kimberly Mcconnell appears to be quite tachypneic upon examination.  She has some facial grimacing and eye wincing which is apparent.  Kimberly Mcconnell's daughter Kimberly Mcconnell and I spoke at length about Kimberly Mcconnell's symptoms and how we can better control them.  We reviewed the normal death and dying physiological processes within the last 24 to 48 hours of life.  I shared with her that Kimberly Mcconnell does appear that she will likely not be with [place] throughout the weekend.  Kimberly Mcconnell lamented on Kimberly Mcconnell's life and we spoke for quite some time about how special Kimberly Mcconnell was is a woman.  Fentanyl boluses had been administered though Kimberly Mcconnell remained to have significant symptom burden.  We were able to modify the fentanyl dose to achieve more appropriate symptom management.  We also liberalized Ativan as needed injections from every 8 hours to every 4 hours in addition to as needed dosages.  I was able to speak to patient's nurse Kimberly Mcconnell about the changes that had been made.  Provided "Gone From My Site" booklet.  Questions and concerns addressed  ___________________________________________________ Addendum:  I checked in on Kimberly Mcconnell at 10 AM this morning.  She  appeared to have much better symptom control.  Per discussion with her daughter Kimberly Mcconnell she had just received a bolus.  We also increase the fentanyl drip rate to 200 mcg an hour.  Kimberly Mcconnell and I spoke again for some time about her feelings related to Kimberly Mcconnell death and dying.  She was very tearful.  I utilized therapeutic listening as a form of emotional support.  We reviewed the plan for symptom management moving forward and I expressed that I would again come by later this afternoon to verify Kimberly Mcconnell is looking comfortable.   Objective Assessment: Vital Signs Vitals:   May 23, 2020 1445 05/23/2020 1500  BP:    Pulse:    Resp: 17 18  Temp:    SpO2:      Intake/Output Summary (Last 24 hours) at 05/16/2020 0950 Last data filed at 05/15/2020 1400 Gross per 24 hour  Intake 0 ml  Output --  Net 0 ml   Physical Exam Vitals and nursing note reviewed.  Constitutional:      General: She is in acute distress.    Appearance: She is cachectic. She is ill-appearing.     Comments: Appears generally uncomfortable Pulmonary:     Comments: Increased work of breathing Skin:    General: Skin is warm and dry.  Ecchymotic areas noted on upper extremities Neurological:     Mental Status: She is lethargic.     Motor: Weakness present.  Psychiatric:        Speech: She is noncommunicative.   SUMMARY OF RECOMMENDATIONS   DNAR/DNI  Comfort focused care  Please refer to [facility] for comfort medications  Pure wick in place with every shift bladder scanning  End of life  Time Spent: 80 Greater than 50% of the time was spent in counseling and coordination of care ______________________________________________________________________________________ Addendum:  I evaluated Kimberly Mcconnell this afternoon for a symptom check - she appeared much more comfortable than this morning. She was still slightly tachypneic therefore provided a bolus and increased her fentanyl gtt. She appears more pale in appearance as compared  to earlier and I suspect she is 12-24 hours from end of life.   Kimberly Mcconnell and I spoke at length about Kimberly Mcconnell's strength as a woman. She shared with me how difficult it has been for she and her husband in the wake of their parents aging. We discussed the polarizing realities that she is facing with her own mortality.   We reviewed the symptomatic plan for the evening.  Questions answered.   Time in: 1605 Time Out: 1640 Time Spent: 35 Greater than 50% of the time was spent in counseling and coordination of care Lamarr Lulas [facility] Palliative Medicine Team Team Cell Phone: [phone] Please utilize secure chat with additional questions, if there is no response within 30 minutes please call the above phone number  Palliative Medicine Team providers are available by phone from 7am to 7pm daily and can be reached through the team cell phone.  Should this patient require assistance outside of these hours, please call the patient's attending physician.

## 2020-05-16 NOTE — Progress Notes (Addendum)
PROGRESS NOTE    EDITA KEACH  NUU:[id number] DOB: 1924-06-22 DOA: 05/13/2020 PCP: Lewis Moccasin, MD   Chief Complaint  Patient presents with  . Fall  . Weakness    Brief Narrative: 87 year old female with history of paroxysmal SVT advanced dementia osteoporosis poor historian at baseline presented to ED 05/13/20 after an witnessed fall during the night on 1/25.   As per the report- she was last seen normal 1/25 afternoon by home care service.  Patient then was found naked on the bathroom floor, confused.  Likely patient had a unwitnessed fall sometime 1/25 night.  But due to dementia and confusion patient cannot provide further information. In ED X-ray showed left hip fracture.  Patient was found to be very tachycardia, lactic acid 7.6, creatinine 1.49 compared to baseline around 1.0.  WBC 20>24K. No clear source of infection was identified, CT abdomen pelvis with contrast showed large left groin hematoma and questionable SFA dissection/injury Vascular and orthopedic were consulted. Patient suspected to have a severe sepsis 1/27-in the ED patient was hypoxic needing non-brebreather, hypotensive in 70s IV fluid bolus was initiated sepsis bundle with lactic acid was ordered and palliative care also urgently saw the patient and after extended discussion-patient's family opted for comfort measures.  Subjective:  Seen this morning, daughter at the bedside.  Patient appears comfortable. Daughter not happy overnight issues where patient was not comfortable, there was issue with incontinence   Assessment & Plan:  Goals of care/COMFORT MEASURE: Continue current comfort measures, appreciate palliative care input they have adjusted patient fentanyl furosemide and Ativan regimen to focus on comfort daughter seems to be happy with current treatment   SIRS w/ hypotension and lactic acidosis: Unclear source, with significantly elevated white count lactic acid and hypotensipn, blood culture were  sent and patient was initially on empiric vancomycin and cefepime.No obvious pneumonia on chest x-ray, UA large le, neg nitrite WBC 0-5 not impressive enough for UTI.  Procalcitonin at 0.4, COVID-19 negative, WBC was worsening 24k and patient hemodynamically unstable.  Patient is now on comfort measures after palliative discussion. Acute hypoxic respiratory failure.  Continue comfort measures Left hip fracture from unwitnessed fall at home.  Was seen by orthopedic on admission AKI on CKD stage IIIa: Creatinine  1.4>1.1 Mild rhabdomyolysis CK IN 600- from ying on the floor nontraumatic Diarrhea at home, unknown period. large left groin hematoma with suspected left SFA Dissection in CTA- [place] art suspected left SFA dissection versus plaque.Seen by Dr. Myra Gianotti ABIs could not be performed secondary to calcification toe pressures were absent.Vascular surgery signed off. Anemia likely anemia of chronic disease and acute blood loss anemia form hip fracture: acute drop Hb 9.3> 7.6 gm  PSVT Acute metabolic encephalopathy in the setting of advanced dementia at baseline very poor short-term memory oriented x1-2  Nutrition: Diet Order            Diet regular Room service appropriate? Yes; Fluid consistency: Thin  Diet effective now                 There is no height or weight on file to calculate BMI.  DVT prophylaxis:  Code Status:   Code Status: DNR  Family Communication: plan of care discussed with patient's daughter at the bedside  Status is: Inpatient Remains inpatient appropriate because:Inpatient level of care appropriate due to severity of illness and Ongoing management of severe sepsis hip fracture AKI, anemia  Dispo: The patient is from: Home, now on comfort measures  Patient currently is not medically stable to d/c.   Difficult to place patient No  Consultants:see note  Procedures:see note  Culture/Microbiology    Component Value Date/Time   SDES BLOOD LEFT HAND  05/14/2020 0015   SPECREQUEST  05/14/2020 0015    BOTTLES DRAWN AEROBIC AND ANAEROBIC Blood Culture adequate volume   CULT  05/14/2020 0015    NO GROWTH 1 DAY Performed at [facility], [address], [place], [place] [address]    REPTSTATUS PENDING 05/14/2020 0015    Other culture-see note  Medications: Scheduled Meds: . antiseptic oral rinse  15 mL Topical BID  . LORazepam  1 mg Intravenous Q4H  . scopolamine  1 patch Transdermal Q72H   Continuous Infusions: . fentaNYL infusion INTRAVENOUS 100 mcg/hr (05/16/20 0440)    Antimicrobials: Anti-infectives (From admission, onward)   Start     Dose/Rate Route Frequency Ordered Stop   05/15/20 1400  vancomycin (VANCOREADY) IVPB 750 mg/150 mL  Status:  Discontinued        750 mg 150 mL/hr over 60 Minutes Intravenous Every 48 hours 06-09-20 1416 05/14/20 1050   05/14/20 1400  ceFEPIme (MAXIPIME) 2 g in sodium chloride 0.9 % 100 mL IVPB  Status:  Discontinued        2 g 200 mL/hr over 30 Minutes Intravenous Every 24 hours 2020/06/09 1416 05/14/20 1050   05/14/20 0600  ceFAZolin (ANCEF) IVPB 2g/100 mL premix  Status:  Discontinued        2 g 200 mL/hr over 30 Minutes Intravenous On call to O.R. 2020/06/09 2122 05/14/20 1050   06/09/20 1400  ceFEPIme (MAXIPIME) 2 g in sodium chloride 0.9 % 100 mL IVPB        2 g 200 mL/hr over 30 Minutes Intravenous  Once 06/09/20 1356 06/09/20 1612   06/09/20 1400  metroNIDAZOLE (FLAGYL) IVPB 500 mg        500 mg 100 mL/hr over 60 Minutes Intravenous  Once 06/09/2020 1356 2020/06/09 1806   06/09/2020 1400  vancomycin (VANCOCIN) IVPB 1000 mg/200 mL premix        1,000 mg 200 mL/hr over 60 Minutes Intravenous  Once 06/09/2020 1356 June 09, 2020 2120     Objective: Vitals: Today's Vitals   05/14/20 1430 05/14/20 1445 05/14/20 1500 05/15/20 0034  BP:      Pulse: 62     Resp: (!) 21 17 18    Temp:      TempSrc:      SpO2: (!) 76%     PainSc:    Asleep    Intake/Output Summary (Last 24 hours) at  05/16/2020 1413 Last data filed at 05/16/2020 0959 Gross per 24 hour  Intake 0 ml  Output --  Net 0 ml   There were no vitals filed for this visit. Weight change:   Intake/Output from previous day: No intake/output data recorded. Intake/Output this shift: No intake/output data recorded. There were no vitals filed for this visit.  Examination: comeplte Physical exam deferred for comfort.   General exam: Unresponsive, breathing spontaneously.   HEENT:Oral mucosa dry Respiratory system: Breathing spontaneously at times slowed  Cardiovascular system: S1 & S2 +. Gastrointestinal system: Exam deferred Nervous System: Unresponsive, exam deferred Extremities: Warm extremities Skin: No rashes,no icterus.  Data Reviewed: I have personally reviewed following labs and imaging studies CBC: Recent Labs  Lab 09-Jun-2020 1301 05/14/20 0015  WBC 20.4* 24.2*  NEUTROABS 16.5*  --   HGB 9.3* 7.6*  HCT 26.7* 21.6*  MCV 86.4  85.4  PLT 177 211   Basic Metabolic Panel: Recent Labs  Lab 06-02-20 1301 05/14/20 0015  NA 137 136  K 4.7 4.5  CL 105 106  CO2 17* 19*  GLUCOSE 182* 153*  BUN 20 22  CREATININE 1.49* 1.19*  CALCIUM 8.4* 8.4*   GFR: CrCl cannot be calculated (Unknown ideal weight.). Liver Function Tests: Recent Labs  Lab 06/02/2020 1301  AST 49*  ALT 20  ALKPHOS 41  BILITOT 1.2  PROT 4.9*  ALBUMIN 3.2*   Recent Labs  Lab 06-02-2020 1301  LIPASE 24   No results for input(s): AMMONIA in the last 168 hours. Coagulation Profile: No results for input(s): INR, PROTIME in the last 168 hours. Cardiac Enzymes: Recent Labs  Lab 2020-06-02 1301  CKTOTAL 664*   BNP (last 3 results) No results for input(s): PROBNP in the last 8760 hours. HbA1C: No results for input(s): HGBA1C in the last 72 hours. CBG: No results for input(s): GLUCAP in the last 168 hours. Lipid Profile: No results for input(s): CHOL, HDL, LDLCALC, TRIG, CHOLHDL, LDLDIRECT in the last 72 hours. Thyroid  Function Tests: No results for input(s): TSH, T4TOTAL, FREET4, T3FREE, THYROIDAB in the last 72 hours. Anemia Panel: No results for input(s): VITAMINB12, FOLATE, FERRITIN, TIBC, IRON, RETICCTPCT in the last 72 hours. Sepsis Labs: Recent Labs  Lab 2020-06-02 1253 2020/06/02 1903 05/14/20 0015  PROCALCITON  --   --  0.44  LATICACIDVEN 7.6* 4.6*  --     Recent Results (from the past 240 hour(s))  Urine culture     Status: Abnormal   Collection Time: 06/02/2020 12:08 AM   Specimen: In/Out Cath Urine  Result Value Ref Range Status   Specimen Description IN/OUT CATH URINE  Final   Special Requests   Final    NONE Performed at [facility], [address], [place], [place] [address]    Culture MULTIPLE SPECIES PRESENT, SUGGEST RECOLLECTION (A)  Final   Report Status 05/15/2020 FINAL  Final  Blood Culture (routine x 2)     Status: None (Preliminary result)   Collection Time: 2020/06/02  3:25 PM   Specimen: BLOOD  Result Value Ref Range Status   Specimen Description BLOOD RIGHT ANTECUBITAL  Final   Special Requests   Final    BOTTLES DRAWN AEROBIC AND ANAEROBIC Blood Culture adequate volume   Culture   Final    NO GROWTH 2 DAYS Performed at [facility], [address] [place], [place] [address]    Report Status PENDING  Incomplete  SARS Coronavirus 2 by RT PCR (hospital order, performed in [facility] hospital lab) Nasopharyngeal Nasopharyngeal Swab     Status: None   Collection Time: 06-02-2020  4:15 PM   Specimen: Nasopharyngeal Swab  Result Value Ref Range Status   SARS Coronavirus 2 NEGATIVE NEGATIVE Final    Comment: (NOTE) SARS-CoV-2 target nucleic acids are NOT DETECTED.  The SARS-CoV-2 RNA is generally detectable in upper and lower respiratory specimens during the acute phase of infection. The lowest concentration of SARS-CoV-2 viral copies this assay can detect is 250 copies / mL. A negative result does not preclude SARS-CoV-2 infection and should not be used as  the sole basis for treatment or other patient management decisions.  A negative result may occur with improper specimen collection / handling, submission of specimen other than nasopharyngeal swab, presence of viral mutation(s) within the areas targeted by this assay, and inadequate number of viral copies (<250 copies / mL).  A negative result must be combined with clinical observations, patient history, and epidemiological information.  Fact Sheet for Patients:   BoilerBrush.com.cy  Fact Sheet for Healthcare Providers: https://pope.com/  This test is not yet approved or  cleared by the [place] FDA and has been authorized for detection and/or diagnosis of SARS-CoV-2 by FDA under an Emergency Use Authorization (EUA).  This EUA will remain in effect (meaning this test can be used) for the duration of the COVID-19 declaration under Section 564(b)(1) of the Act, 21 U.S.C. section 360bbb-3(b)(1), unless the authorization is terminated or revoked sooner.  Performed at [facility], [address], [place], [place] [address]   Blood Culture (routine x 2)     Status: None (Preliminary result)   Collection Time: 05/14/20 12:15 AM   Specimen: BLOOD LEFT HAND  Result Value Ref Range Status   Specimen Description BLOOD LEFT HAND  Final   Special Requests   Final    BOTTLES DRAWN AEROBIC AND ANAEROBIC Blood Culture adequate volume   Culture   Final    NO GROWTH 1 DAY Performed at [facility], [address] [place], [place] [address]    Report Status PENDING  Incomplete     Radiology Studies: No results found.   LOS: 3 days   Lanae Boast, MD [facility]  05/16/2020, 2:13 PM

## 2020-05-18 ENCOUNTER — Encounter: Payer: Medicare Other | Admitting: Nurse Practitioner

## 2020-05-18 LAB — CULTURE, BLOOD (ROUTINE X 2)
Culture: NO GROWTH
Special Requests: ADEQUATE

## 2020-05-19 LAB — CULTURE, BLOOD (ROUTINE X 2)
Culture: NO GROWTH
Special Requests: ADEQUATE

## 2020-05-19 NOTE — Progress Notes (Signed)
Called by NT to room , patient noted with no respiration and pulse. She is DNR, pronounced death by this Clinical research associate and another Charity fundraiser. Daughter at bedside.notified on call MD.

## 2020-05-19 NOTE — Progress Notes (Signed)
Post mortem  Carecompleted,body transferred  to the morgue. Patient placement notified.

## 2020-05-19 NOTE — Progress Notes (Signed)
Wasted 120 ml of Fentanyl in steri cycle bin witnessed by [employer].

## 2020-05-19 NOTE — Death Summary Note (Signed)
DEATH SUMMARY   Patient Details  Name: Kimberly Mcconnell MRN: [id number] DOB: 10-05-1924  Admission/Discharge Information   Admit Date:  06-05-2020  Date of Death: Date of Death: 06/09/20  Time of Death: Time of Death: 0117  Length of Stay: 4  Referring Physician: Lewis Moccasin, MD   Reason(s) for Hospitalization  Fall w/ hip fracture  Diagnoses  Preliminary cause of death:Comminuted and displaced left intertrochanteric left hip fracture, likley from unwitnessed fall at home.  Secondary Diagnoses (including complications and co-morbidities):    SIRS  With initial concern for sepsis due to significant leukocytosis, hypotension,tachycardia tachypnea, lactic acidosis: Initially on empiric antibiotics and fluid resuscitation o nadmission.Patient still remained hypotensive and was also hypoxic and having pain issues on left hip ( unable to give opiates due to hypotension/hypoxia and in-fact needed trail of reversal)-seen by palliative care and after extensive discussion transitioned to comfort measures.  Blood culture has come back negative-sepsis ruled out.  Hematoma in the left thigh tracking through the muscle plain from fracture and suspected SFA dissection Suspected left SFA Dissection in CTA- [place] art suspected left SFA dissection versus plaque.Seen by Dr. Myra Gianotti ABIs could not be performed secondary to calcification, toe pressures were absent.  Hypotension.  Acute blood loss anemia,with anemia of chronic disease likely from hip fracture/left groin hematoma with suspected SFA dissection- Hb 9.3> 7.6 gm , pt becoming more hypotensive I nED  Acute metabolic encephalopathy in the setting of advanced dementia at baseline very poor short-term memory oriented x1-2. Acute hypoxic respiratory failure-needing nonrebreather in ED.  AKI on CKD stage IIIa: Creatinine  1.4>1.1 Metabolic acidosis Mild rhabdomyolysis CK IN 600- from ying on the floor nontraumatic Diarrhea at home, unknown  period. Elevated AST/transaminitis PSVT   Brief Hospital Course (including significant findings, care, treatment, and services provided and events leading to death)  Kimberly Mcconnell is a 87 y.o. year old female w/ history of paroxysmal SVT advanced dementia osteoporosis poor historian at baseline presented to ED June 05, 2020 after an witnessed fall during the night on 1/25.   As per the report- she was last seen normal 1/25 afternoon by home care service.Patient then was found naked on the bathroom floor, confused.Likely patient had a unwitnessed fall sometime 1/25 night.But due to dementia and confusion patient could not provide further information. In ED X-ray showed left hip fracture.Patient was found to be very tachycardia, lactic acid 7.6, creatinine 1.49compared to baseline around 1.0. WBC 20>24K.No clear source of infection was identified, CT abdomen pelvis with contrast showed large left groin hematoma and questionable SFA dissection/injury Vascular and orthopedic were consulted. 1/27-in the ED patient was severely hypoxic needing non-brebreather, hypotensive in 70s IV fluid bolus was initiated and due to her finding of hypotension tachycardia tachypnea leukocytosis sepsis suspected she was continued IV antibiotics fluid resuscitation.  Patient needed trial of reversal of her opiates to see if it helps her blood pressure/oxygenation, but not much improvement.  Patient daughter desires that patient is still pain-free.  Patient is doing stable for any kind of surgery at this time and was getting hypotensive hypoxic in the ED, and now is also grimacing in the face likely from pain.  Patient was seen by palliative care and had extensive family meeting and she was transitioned to comfort measures. I discussed with family escalation of care includes but not limited to moving to ICU intubation pressors. Patient was then moved to the floor on comfort measures and peacefully passed with family at  bedside, and was  followed very closely by palliative care team.    Pertinent Labs and Studies  Significant Diagnostic Studies DG Chest 2 View  Result Date: 04/21/2020 CLINICAL DATA:  87 year old female status post fall.  Left hip pain. EXAM: CHEST - 2 VIEW COMPARISON:  Chest radiographs 10/17/2017 and earlier. FINDINGS: Semi upright AP and lateral views of the chest. Moderate to large chronic hiatal hernia with an air-fluid level today. Borderline cardiomegaly. Other mediastinal contours are within normal limits. Calcified aortic atherosclerosis. Large lung volumes. No pneumothorax, pulmonary edema, pleural effusion or acute pulmonary opacity. Osteopenia.  No acute osseous abnormality identified. IMPRESSION: 1. No acute cardiopulmonary abnormality. 2. Chronic pulmonary hyperinflation. Moderate to large chronic hiatal hernia. Aortic Atherosclerosis (ICD10-I70.0). Electronically Signed   By: Odessa Fleming M.D.   On: 05/02/2020 11:54   CT Head Wo Contrast  Result Date: 04/29/2020 CLINICAL DATA:  Neck trauma, fall with accident in the bathroom. 87 year old female. EXAM: CT HEAD WITHOUT CONTRAST CT CERVICAL SPINE WITHOUT CONTRAST TECHNIQUE: Multidetector CT imaging of the head and cervical spine was performed following the standard protocol without intravenous contrast. Multiplanar CT image reconstructions of the cervical spine were also generated. COMPARISON:  MRI of the brain from October of 2012 FINDINGS: CT HEAD FINDINGS Brain: No evidence of acute infarction, hemorrhage, hydrocephalus, extra-axial collection or mass lesion/mass effect. Marked atrophy and chronic microvascular ischemic changes. Findings are similar to exam from 2012. Vascular: No hyperdense vessel or unexpected calcification. Skull: Normal. Negative for fracture or focal lesion. Sinuses/Orbits: Mild LEFT maxillary and ethmoid sinus disease. Orbits without acute finding. Other: None CT CERVICAL SPINE FINDINGS Alignment: Assessment limited by gross  patient motion. Particularly at the C2 level. Repeat attempted. Exaggerated lordotic curvature of the cervical spine. Likely positional. Skull base and vertebrae: Atlantooccipital joints with normal relationships. Lantus dens interval grossly normal. Motion related artifact is significant about C2 despite repeat images. Posterior elements with normal relationships at this level no visible fracture on motion compromised images. Soft tissues and spinal canal: No prevertebral fluid or swelling. No visible canal hematoma. Disc levels: Multilevel spondylosis greatest C2-3 and C3-4 and C6-7. Upper chest: Negative. Other: None IMPRESSION: 1. No acute intracranial abnormality. 2. Marked atrophy and chronic microvascular ischemic changes of the brain, similar to exam from 2012. 3. Assessment of the cervical spine limited by gross patient motion. Particularly at the C2 level. Normal lesion sips of posterior elements and no visible fracture on coronal reconstructions. Limitations are present despite attempted repeat images. If there is high clinical suspicion for fracture images could be repeated though may yield similar results. 4. Multilevel spondylosis. Electronically Signed   By: Donzetta Kohut M.D.   On: 04/22/2020 15:44   CT Cervical Spine Wo Contrast  Result Date: 04/23/2020 CLINICAL DATA:  Neck trauma, fall with accident in the bathroom. 87 year old female. EXAM: CT HEAD WITHOUT CONTRAST CT CERVICAL SPINE WITHOUT CONTRAST TECHNIQUE: Multidetector CT imaging of the head and cervical spine was performed following the standard protocol without intravenous contrast. Multiplanar CT image reconstructions of the cervical spine were also generated. COMPARISON:  MRI of the brain from October of 2012 FINDINGS: CT HEAD FINDINGS Brain: No evidence of acute infarction, hemorrhage, hydrocephalus, extra-axial collection or mass lesion/mass effect. Marked atrophy and chronic microvascular ischemic changes. Findings are similar to  exam from 2012. Vascular: No hyperdense vessel or unexpected calcification. Skull: Normal. Negative for fracture or focal lesion. Sinuses/Orbits: Mild LEFT maxillary and ethmoid sinus disease. Orbits without acute finding. Other: None CT CERVICAL SPINE FINDINGS Alignment: Assessment  limited by gross patient motion. Particularly at the C2 level. Repeat attempted. Exaggerated lordotic curvature of the cervical spine. Likely positional. Skull base and vertebrae: Atlantooccipital joints with normal relationships. Lantus dens interval grossly normal. Motion related artifact is significant about C2 despite repeat images. Posterior elements with normal relationships at this level no visible fracture on motion compromised images. Soft tissues and spinal canal: No prevertebral fluid or swelling. No visible canal hematoma. Disc levels: Multilevel spondylosis greatest C2-3 and C3-4 and C6-7. Upper chest: Negative. Other: None IMPRESSION: 1. No acute intracranial abnormality. 2. Marked atrophy and chronic microvascular ischemic changes of the brain, similar to exam from 2012. 3. Assessment of the cervical spine limited by gross patient motion. Particularly at the C2 level. Normal lesion sips of posterior elements and no visible fracture on coronal reconstructions. Limitations are present despite attempted repeat images. If there is high clinical suspicion for fracture images could be repeated though may yield similar results. 4. Multilevel spondylosis. Electronically Signed   By: Donzetta Kohut M.D.   On: 04/21/2020 15:44   CT ABDOMEN PELVIS W CONTRAST  Result Date: 04/22/2020 CLINICAL DATA:  Suspected diverticulitis. Status post fall with known LEFT hip fracture. EXAM: CT ABDOMEN AND PELVIS WITH CONTRAST TECHNIQUE: Multidetector CT imaging of the abdomen and pelvis was performed using the standard protocol following bolus administration of intravenous contrast. CONTRAST:  75mL OMNIPAQUE IOHEXOL 300 MG/ML  SOLN COMPARISON:   October 03, 2003 FINDINGS: Lower chest: Hiatal hernia extending into the chest. No consolidation. No pleural effusion. Hepatobiliary: Normal appearance of hepatic parenchyma. Patent portal vein. No pericholecystic stranding. No biliary duct dilation. Pancreas: Normal, without mass, inflammation or ductal dilatation. Spleen: Normal spleen. Adrenals/Urinary Tract: Adrenal glands with mild nodularity, otherwise unremarkable no hydronephrosis. Symmetric renal enhancement. Configuration urinary bladder implies pelvic floor dysfunction but is otherwise unremarkable. Stomach/Bowel: Large hiatal hernia. No acute gastrointestinal process. Colonic diverticulosis. Normal appendix. Vascular/Lymphatic: Extensive atheromatous plaque both calcified and noncalcified in the abdominal aorta extending in the iliac and femoral vasculature. Also extending into visceral branches. No aneurysmal dilation. Irregularity of LEFT superficial femoral artery with attenuation of contrast column and some stranding around the vessel in the setting of extensive LEFT lower extremity stranding following markedly displaced LEFT proximal femoral fracture. There is no gastrohepatic or hepatoduodenal ligament lymphadenopathy. No retroperitoneal or mesenteric lymphadenopathy. No pelvic sidewall lymphadenopathy. Reproductive: Unremarkable CT appearance of uterus and adnexa. Other: No ascites. Musculoskeletal: Marked Verus angulation of a comminuted LEFT intertrochanteric fracture with separate fragments of both greater and lesser trochanter. Marked stranding in the area and edema and hematoma tracking throughout musculature. Superficial femoral artery with irregularity and limited opacification, potentially occluded. Profunda femoral artery, visualized portions on the LEFT is patent. Expansion of musculature in the LEFT proximal thigh compatible with hematoma surrounding the fracture. IMPRESSION: 1. Signs of comminuted and displaced intertrochanteric fracture  of the LEFT femur with adjacent LEFT superficial femoral artery showing long segment irregularity, wall thickening and limited opacification suspicious for vascular injury such as posttraumatic dissection though the area is incompletely imaged. Suggest CT angiography of the LEFT lower extremity for further evaluation. There is vascular disease in this location i.e. atherosclerotic changes but these do not appear to account for the degree of abnormality seen on the current study. 2. Hematoma in the LEFT thigh tracking through muscular planes likely related to fracture. No visible extravasation though upper thigh is incompletely imaged. Attention to this area on subsequent imaging in the setting of suspected vascular injury. 3. Large hiatal  hernia extending into the chest. 4. Colonic diverticulosis without evidence of acute diverticulitis. 5. Aortic atherosclerosis. These results were called by telephone at the time of interpretation on 05/06/2020 at 4:08 pm to provider [facility] , who verbally acknowledged these results. Aortic Atherosclerosis (ICD10-I70.0). Electronically Signed   By: Donzetta Kohut M.D.   On: 04/22/2020 16:03   DG Chest Port 1 View  Result Date: 16-May-2020 CLINICAL DATA:  87 year old female with weakness and hypoxia. EXAM: PORTABLE CHEST 1 VIEW COMPARISON:  Chest radiographs 04/19/2020 and earlier. FINDINGS: Portable AP semi upright view at 0814 hours. Moderate size hiatal hernia again noted. Other mediastinal contours are within normal limits. Visualized tracheal air column is within normal limits. Stable lung volumes. Allowing for portable technique the lungs are clear. Visible bowel gas pattern in the upper abdomen within normal limits. Stable visualized osseous structures. IMPRESSION: No acute cardiopulmonary abnormality.  Hiatal hernia. Electronically Signed   By: Odessa Fleming M.D.   On: May 16, 2020 08:28   VAS [place] GROIN PSEUDOANEURYSM  Result Date: 2020-05-16  ARTERIAL PSEUDOANEURYSM   Exam: Left groin Indications: Patient complains of Possible dissection of SFA by CTA. History: Hip fracture. Severe dementia. Limitations: hematoma Comparison Study: No prior study Performing Technologist: Sherren Kerns RVS  Examination Guidelines: A complete evaluation includes B-mode imaging, spectral Doppler, color Doppler, and power Doppler as needed of all accessible portions of each vessel. Bilateral testing is considered an integral part of a complete examination. Limited examinations for reoccurring indications may be performed as noted.  Summary: Area noted in the common femoral artery possibly suggestive of dissection versus plaque. Diagnosing physician: Lemar Livings MD Electronically signed by Lemar Livings MD on May 16, 2020 at 1:58:19 PM.   --------------------------------------------------------------------------------    Final    VAS [place] ABI WITH/WO TBI  Result Date: May 16, 2020 LOWER EXTREMITY DOPPLER STUDY Indications: Peripheral artery disease, and Questionable SFA dissection by CTA. Other Factors: Hip fracture. Advanced dementia.  Limitations: Today's exam was limited due to patient positioning and involuntary              patient movement. Comparison Study: No prior study on file Performing Technologist: Sherren Kerns RVS  Examination Guidelines: A complete evaluation includes at minimum, Doppler waveform signals and systolic blood pressure reading at the level of bilateral brachial, anterior tibial, and posterior tibial arteries, when vessel segments are accessible. Bilateral testing is considered an integral part of a complete examination. Photoelectric Plethysmograph (PPG) waveforms and toe systolic pressure readings are included as required and additional duplex testing as needed. Limited examinations for reoccurring indications may be performed as noted.  ABI Findings: +---------+------------------+-----+------------+--------+ Right    Rt Pressure (mmHg)IndexWaveform    Comment   +---------+------------------+-----+------------+--------+ PTA                             not assessed         +---------+------------------+-----+------------+--------+ DP       87                0.64 monophasic           +---------+------------------+-----+------------+--------+ Great Toe6                 0.04                      +---------+------------------+-----+------------+--------+ +---------+------------------+-----+----------+-------+ Left     Lt Pressure (mmHg)IndexWaveform  Comment +---------+------------------+-----+----------+-------+ Brachial 137                                      +---------+------------------+-----+----------+-------+  PTA      254               1.85 monophasic        +---------+------------------+-----+----------+-------+ DP       48                0.35 monophasic        +---------+------------------+-----+----------+-------+ Great Toe0                 0.00                   +---------+------------------+-----+----------+-------+ +-------+-----------+-----------+------------+------------+ ABI/TBIToday's ABIToday's TBIPrevious ABIPrevious TBI +-------+-----------+-----------+------------+------------+ Right  0.64       0.04                                +-------+-----------+-----------+------------+------------+ Left   calcified  absent                              +-------+-----------+-----------+------------+------------+  Summary: Right: Resting right ankle-brachial index indicates moderate right lower extremity arterial disease. The right toe-brachial index is abnormal. Left: Resting left ankle-brachial index indicates noncompressible left lower extremity arteries. The left toe-brachial index is abnormal.  *See table(s) above for measurements and observations.  Electronically signed by Lemar LivingsBrandon Cain MD on Jul 20, 2020 at 1:57:55 PM.    Final    DG HIP UNILAT WITH PELVIS 2-3 VIEWS LEFT  Result Date:  05/11/2020 CLINICAL DATA:  LEFT hip externally rotated unable to internally rotate. Status post fall in this 87 year old female EXAM: DG HIP (WITH OR WITHOUT PELVIS) 2-3V RIGHT; DG HIP (WITH OR WITHOUT PELVIS) 2-3V LEFT COMPARISON:  No recent comparisons. Prior CT of the abdomen and pelvis from 2005. FINDINGS: AP view of the pelvis without signs of fracture of the bony pelvis. Assessment mildly limited by stool and gas overlying the sacrum and iliac wings bilaterally. RIGHT hip: Osteopenia. No fracture about the RIGHT hip. Soft tissues are unremarkable. LEFT hip: Comminuted intratrochanteric fracture of the LEFT proximal femur with varus angulation. Standard lateral view not obtainable due to severity of the fracture. Oblique view provided. No cross-table lateral. IMPRESSION: 1. Comminuted intratrochanteric fracture of the LEFT hip with moderate to marked varus angulation. 2. No fracture about the RIGHT hip or bony pelvis. 3. Osteopenia. Electronically Signed   By: Donzetta KohutGeoffrey  Wile M.D.   On: 05/07/2020 12:03   DG HIP UNILAT WITH PELVIS 2-3 VIEWS RIGHT  Result Date: 04/21/2020 CLINICAL DATA:  LEFT hip externally rotated unable to internally rotate. Status post fall in this 87 year old female EXAM: DG HIP (WITH OR WITHOUT PELVIS) 2-3V RIGHT; DG HIP (WITH OR WITHOUT PELVIS) 2-3V LEFT COMPARISON:  No recent comparisons. Prior CT of the abdomen and pelvis from 2005. FINDINGS: AP view of the pelvis without signs of fracture of the bony pelvis. Assessment mildly limited by stool and gas overlying the sacrum and iliac wings bilaterally. RIGHT hip: Osteopenia. No fracture about the RIGHT hip. Soft tissues are unremarkable. LEFT hip: Comminuted intratrochanteric fracture of the LEFT proximal femur with varus angulation. Standard lateral view not obtainable due to severity of the fracture. Oblique view provided. No cross-table lateral. IMPRESSION: 1. Comminuted intratrochanteric fracture of the LEFT hip with moderate to  marked varus angulation. 2. No fracture about the RIGHT hip or bony pelvis. 3. Osteopenia. Electronically Signed   By: Donzetta KohutGeoffrey  Wile M.D.   On: 05/05/2020  12:03    Microbiology Recent Results (from the past 240 hour(s))  Urine culture     Status: Abnormal   Collection Time: Jun 06, 2020 12:08 AM   Specimen: In/Out Cath Urine  Result Value Ref Range Status   Specimen Description IN/OUT CATH URINE  Final   Special Requests   Final    NONE Performed at [facility], [address] [place], [place] [address]    Culture MULTIPLE SPECIES PRESENT, SUGGEST RECOLLECTION (A)  Final   Report Status 05/15/2020 FINAL  Final  Blood Culture (routine x 2)     Status: None (Preliminary result)   Collection Time: June 06, 2020  3:25 PM   Specimen: BLOOD  Result Value Ref Range Status   Specimen Description BLOOD RIGHT ANTECUBITAL  Final   Special Requests   Final    BOTTLES DRAWN AEROBIC AND ANAEROBIC Blood Culture adequate volume   Culture   Final    NO GROWTH 4 DAYS Performed at [facility], [address] [place], [place] [address]    Report Status PENDING  Incomplete  SARS Coronavirus 2 by RT PCR (hospital order, performed in [facility] hospital lab) Nasopharyngeal Nasopharyngeal Swab     Status: None   Collection Time: 06/06/20  4:15 PM   Specimen: Nasopharyngeal Swab  Result Value Ref Range Status   SARS Coronavirus 2 NEGATIVE NEGATIVE Final    Comment: (NOTE) SARS-CoV-2 target nucleic acids are NOT DETECTED.  The SARS-CoV-2 RNA is generally detectable in upper and lower respiratory specimens during the acute phase of infection. The lowest concentration of SARS-CoV-2 viral copies this assay can detect is 250 copies / mL. A negative result does not preclude SARS-CoV-2 infection and should not be used as the sole basis for treatment or other patient management decisions.  A negative result may occur with improper specimen collection / handling, submission of specimen other than  nasopharyngeal swab, presence of viral mutation(s) within the areas targeted by this assay, and inadequate number of viral copies (<250 copies / mL). A negative result must be combined with clinical observations, patient history, and epidemiological information.  Fact Sheet for Patients:   BoilerBrush.com.cy  Fact Sheet for Healthcare Providers: https://pope.com/  This test is not yet approved or  cleared by the [place] FDA and has been authorized for detection and/or diagnosis of SARS-CoV-2 by FDA under an Emergency Use Authorization (EUA).  This EUA will remain in effect (meaning this test can be used) for the duration of the COVID-19 declaration under Section 564(b)(1) of the Act, 21 U.S.C. section 360bbb-3(b)(1), unless the authorization is terminated or revoked sooner.  Performed at [facility], [address], [place], [place] [address]   Blood Culture (routine x 2)     Status: None (Preliminary result)   Collection Time: 05/12/2020 12:15 AM   Specimen: BLOOD LEFT HAND  Result Value Ref Range Status   Specimen Description BLOOD LEFT HAND  Final   Special Requests   Final    BOTTLES DRAWN AEROBIC AND ANAEROBIC Blood Culture adequate volume   Culture   Final    NO GROWTH 3 DAYS Performed at [facility], [address], [place], [place] [address]    Report Status PENDING  Incomplete    Lab Basic Metabolic Panel: Recent Labs  Lab Jun 06, 2020 1301 05/16/2020 0015  NA 137 136  K 4.7 4.5  CL 105 106  CO2 17* 19*  GLUCOSE 182* 153*  BUN 20 22  CREATININE 1.49* 1.19*  CALCIUM 8.4* 8.4*   Liver Function Tests: Recent Labs  Lab 04/26/2020 1301  AST 49*  ALT 20  ALKPHOS 41  BILITOT 1.2  PROT 4.9*  ALBUMIN 3.2*   Recent Labs  Lab 05/11/2020 1301  LIPASE 24   No results for input(s): AMMONIA in the last 168 hours. CBC: Recent Labs  Lab 05/01/2020 1301 05/10/2020 0015  WBC 20.4* 24.2*  NEUTROABS 16.5*   --   HGB 9.3* 7.6*  HCT 26.7* 21.6*  MCV 86.4 85.4  PLT 177 211   Cardiac Enzymes: Recent Labs  Lab 04/27/2020 1301  CKTOTAL 664*   Sepsis Labs: Recent Labs  Lab 05/02/2020 1253 05/08/2020 1301 04/26/2020 1903 04/27/2020 0015  PROCALCITON  --   --   --  0.44  WBC  --  20.4*  --  24.2*  LATICACIDVEN 7.6*  --  4.6*  --     Procedures/Operations  none   Rafe Mackowski 2020-05-19, 2:02 PM

## 2020-05-19 NOTE — Plan of Care (Signed)
  Problem: Role Relationship: Goal: Family's ability to cope with current situation will improve Outcome: Progressing   

## 2020-05-19 DEATH — deceased

## 2020-06-05 DIAGNOSIS — R0902 Hypoxemia: Secondary | ICD-10-CM

## 2021-04-20 IMAGING — CR DG CHEST 2V
2 series · 2 of 2 positions shown · non-contrast
Comparison: Chest radiographs 10/17/2017 and earlier.

CLINICAL DATA: [AGE] female status post fall.  Left hip pain.

EXAM:
CHEST - 2 VIEW

[chest lat]
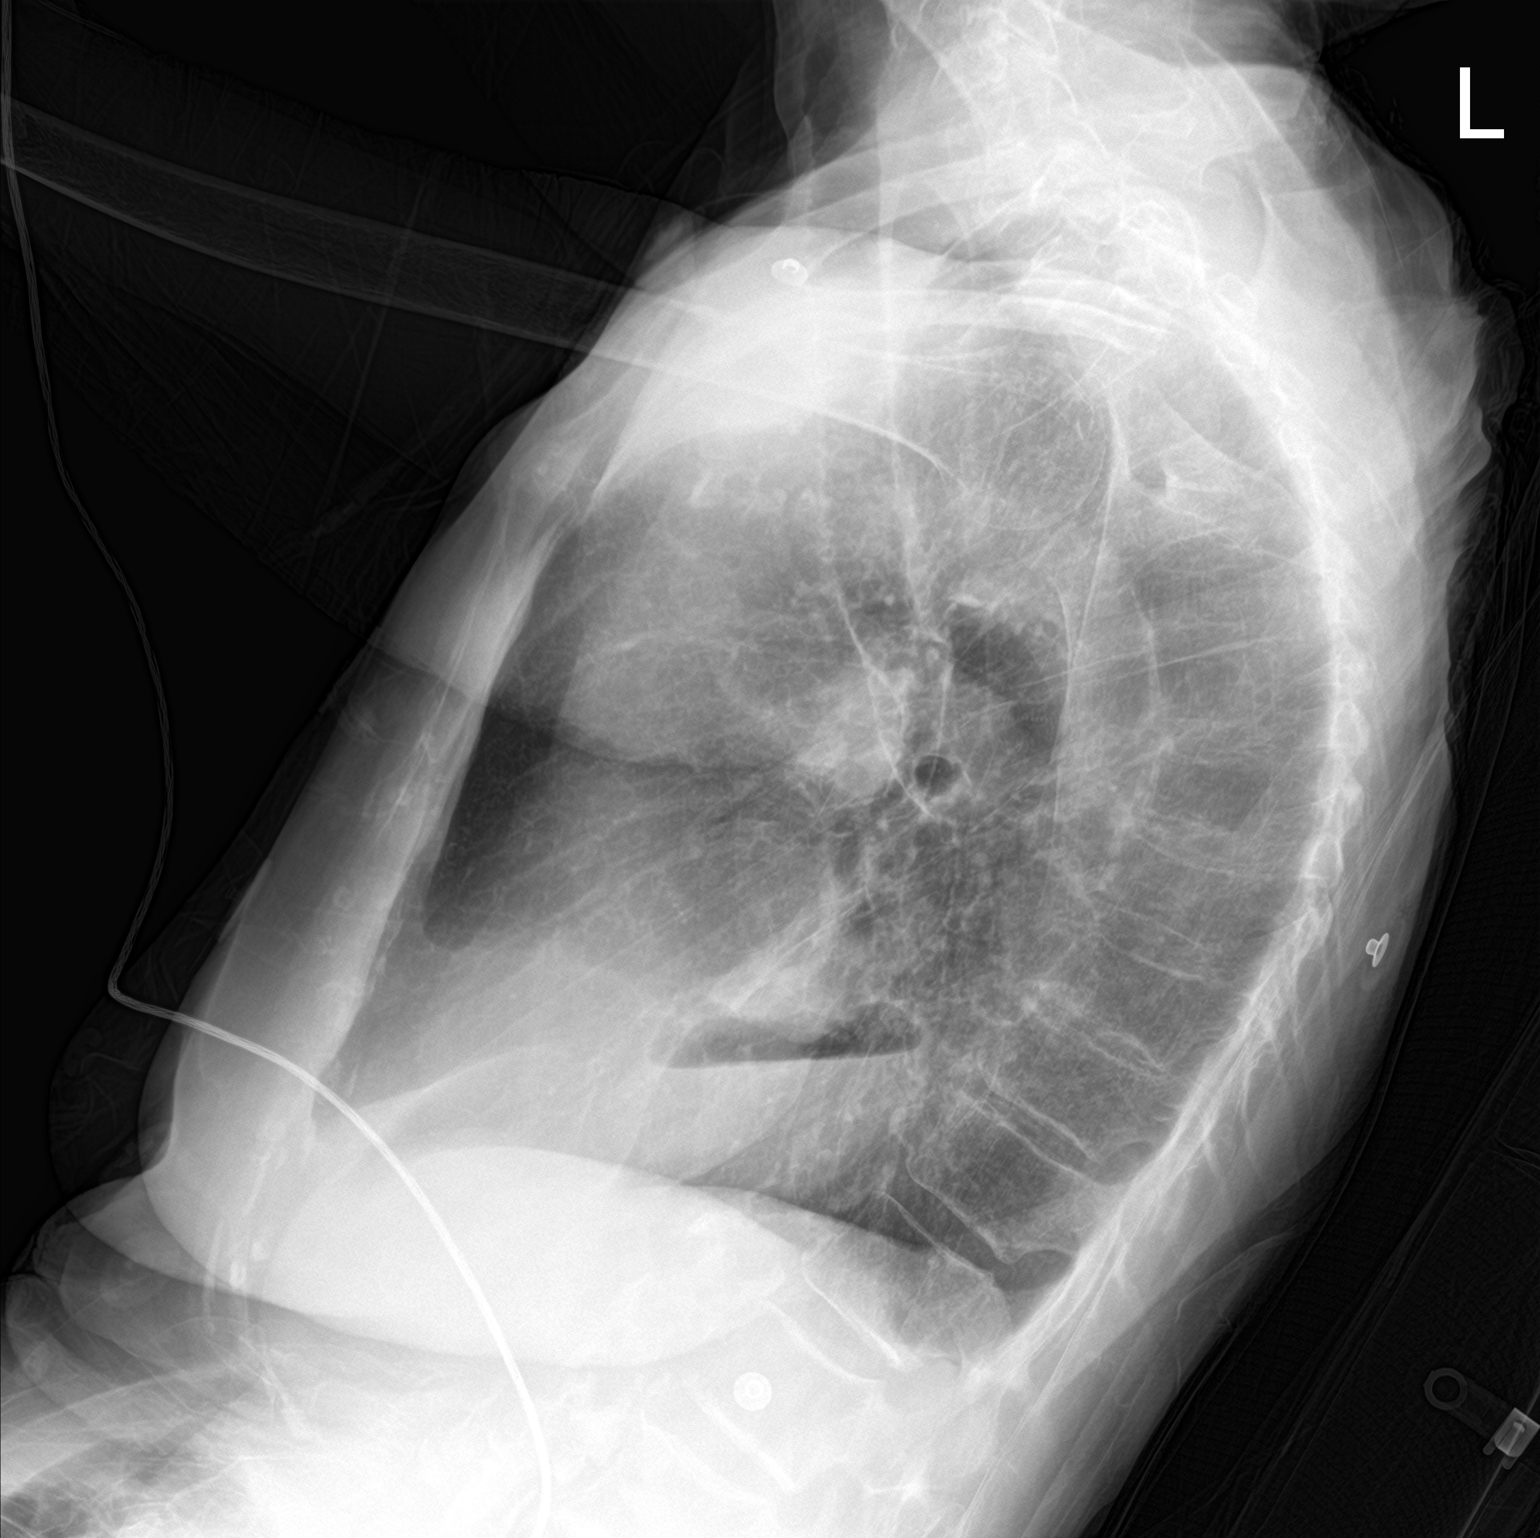

[chest ap]
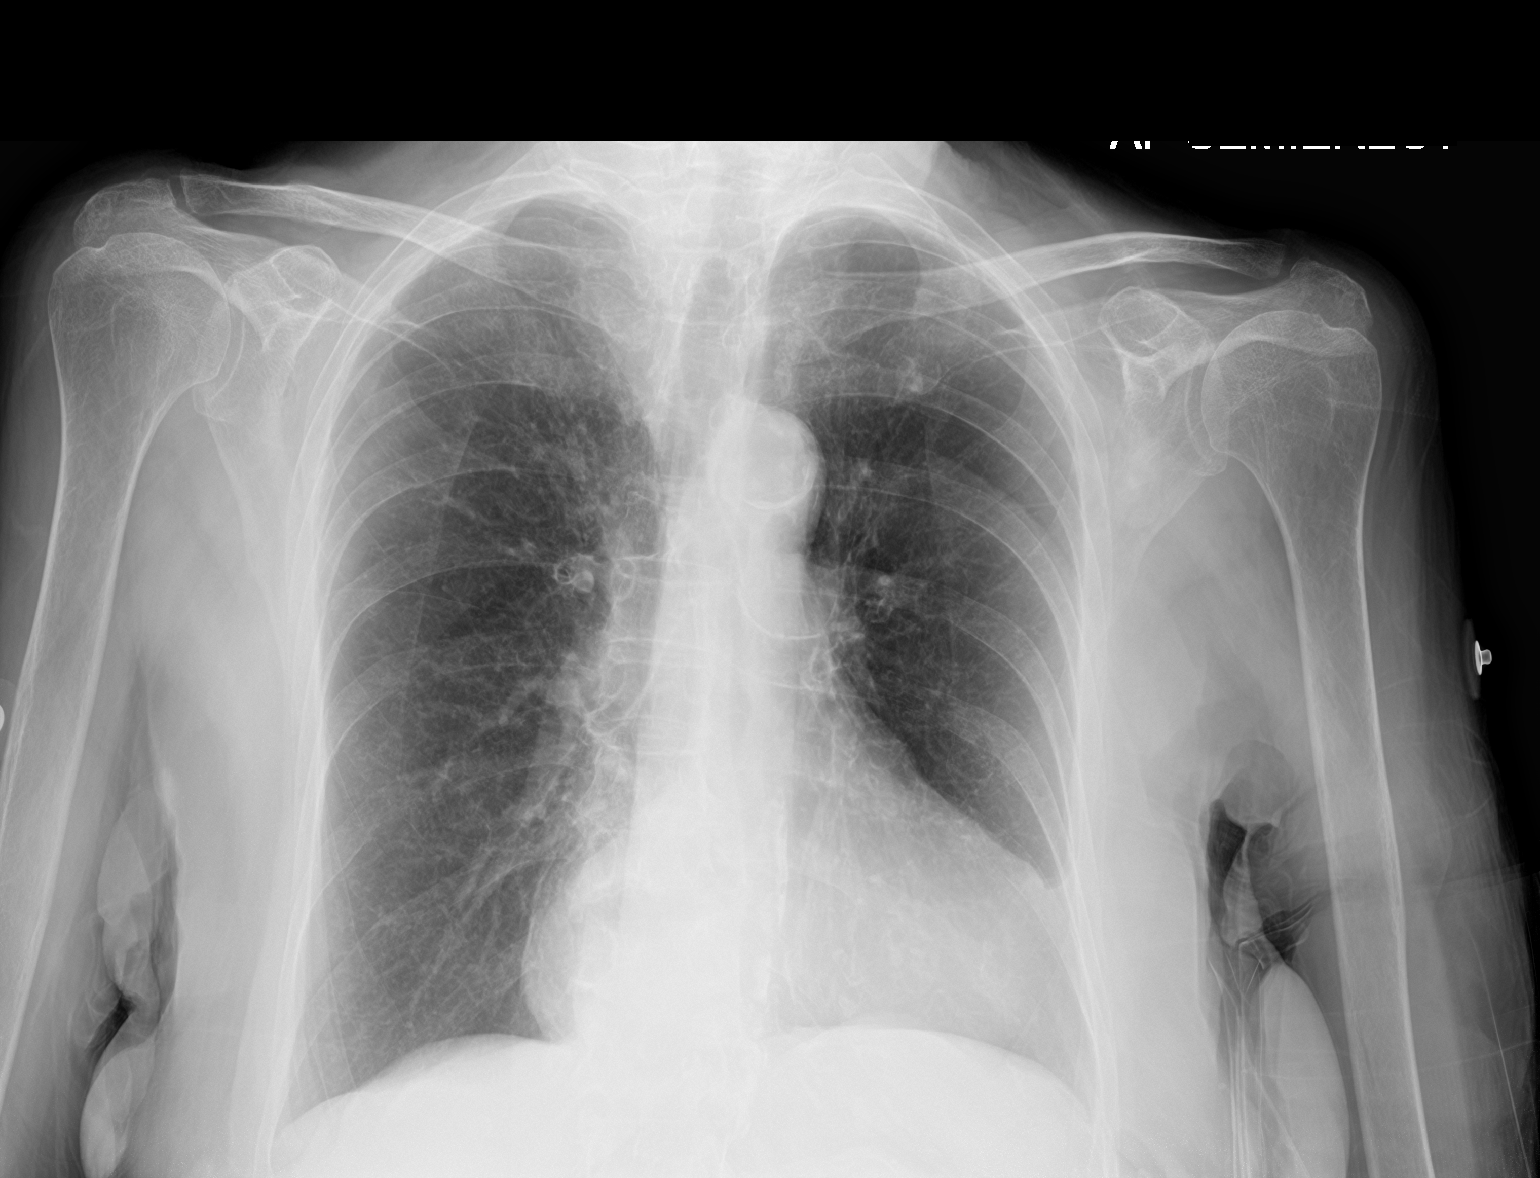

[2 of 2 positions shown; findings below may reference images not displayed]

FINDINGS: Semi upright AP and lateral views of the chest. Moderate to large
chronic hiatal hernia with an air-fluid level today. Borderline
cardiomegaly. Other mediastinal contours are within normal limits.
Calcified aortic atherosclerosis.

Large lung volumes. No pneumothorax, pulmonary edema, pleural
effusion or acute pulmonary opacity.

Osteopenia.  No acute osseous abnormality identified.
IMPRESSION: 1. No acute cardiopulmonary abnormality.
2. Chronic pulmonary hyperinflation. Moderate to large chronic
hiatal hernia. Aortic Atherosclerosis ([id number]-[id number]).

## 2021-04-21 IMAGING — DX DG CHEST 1V PORT
1 series · 1 of 1 positions shown · non-contrast
Comparison: Chest radiographs 05/13/2020 and earlier.

CLINICAL DATA: [AGE] female with weakness and hypoxia.

EXAM:
PORTABLE CHEST 1 VIEW

[chest ap]
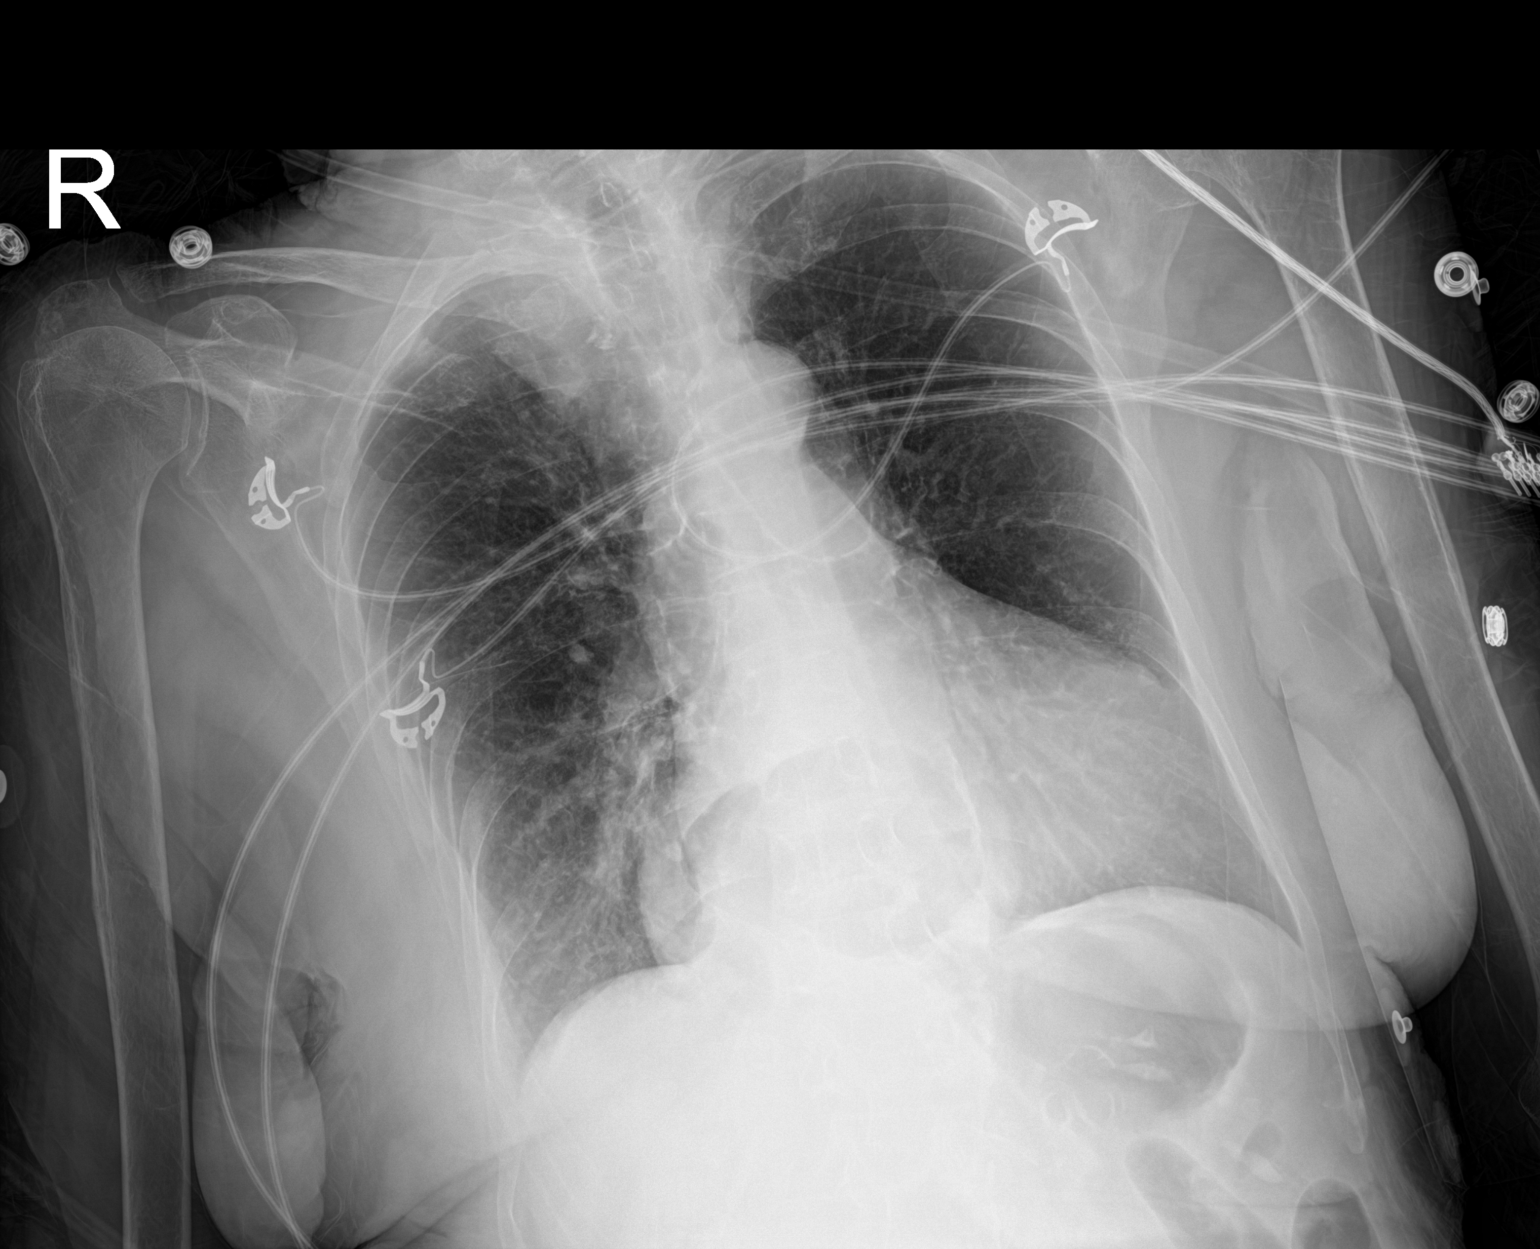

[1 of 1 positions shown; findings below may reference images not displayed]

FINDINGS: Portable AP semi upright view at [id number] hours. Moderate size hiatal
hernia again noted. Other mediastinal contours are within normal
limits. Visualized tracheal air column is within normal limits.
Stable lung volumes. Allowing for portable technique the lungs are
clear. Visible bowel gas pattern in the upper abdomen within normal
limits. Stable visualized osseous structures.
IMPRESSION: No acute cardiopulmonary abnormality.  Hiatal hernia.
